# Patient Record
Sex: Female | Born: 1935 | Race: White | Hispanic: No | Smoking: Former smoker
Health system: Southern US, Community
[De-identification: ages and names within clinical notes are randomized; demographics above are authoritative.]

## PROBLEM LIST (undated history)

## (undated) DIAGNOSIS — E559 Vitamin D deficiency, unspecified: Secondary | ICD-10-CM

## (undated) DIAGNOSIS — D696 Thrombocytopenia, unspecified: Secondary | ICD-10-CM

## (undated) DIAGNOSIS — I517 Cardiomegaly: Secondary | ICD-10-CM

## (undated) DIAGNOSIS — J40 Bronchitis, not specified as acute or chronic: Secondary | ICD-10-CM

## (undated) DIAGNOSIS — R002 Palpitations: Secondary | ICD-10-CM

## (undated) DIAGNOSIS — B029 Zoster without complications: Secondary | ICD-10-CM

## (undated) DIAGNOSIS — I10 Essential (primary) hypertension: Secondary | ICD-10-CM

## (undated) DIAGNOSIS — R0989 Other specified symptoms and signs involving the circulatory and respiratory systems: Secondary | ICD-10-CM

## (undated) DIAGNOSIS — E785 Hyperlipidemia, unspecified: Secondary | ICD-10-CM

## (undated) DIAGNOSIS — D649 Anemia, unspecified: Secondary | ICD-10-CM

## (undated) HISTORY — PX: CHOLECYSTECTOMY: SHX55

## (undated) HISTORY — DX: Zoster without complications: B02.9

## (undated) HISTORY — PX: ABDOMINAL HYSTERECTOMY: SHX81

## (undated) HISTORY — PX: CATARACT EXTRACTION: SUR2

## (undated) HISTORY — PX: OTHER SURGICAL HISTORY: SHX169

## (undated) HISTORY — DX: Hyperlipidemia, unspecified: E78.5

## (undated) HISTORY — PX: DENTAL SURGERY: SHX609

## (undated) HISTORY — DX: Essential (primary) hypertension: I10

## (undated) HISTORY — PX: CERVICAL SPINE SURGERY: SHX589

## (undated) HISTORY — DX: Bronchitis, not specified as acute or chronic: J40

## (undated) HISTORY — PX: LUMBAR SPINE SURGERY: SHX701

## (undated) HISTORY — DX: Cardiomegaly: I51.7

## (undated) HISTORY — PX: SHOULDER SURGERY: SHX246

## (undated) HISTORY — DX: Palpitations: R00.2

## (undated) HISTORY — PX: EXTERNAL EAR SURGERY: SHX627

## (undated) HISTORY — PX: JOINT REPLACEMENT: SHX530

---

## 1998-01-21 ENCOUNTER — Ambulatory Visit (HOSPITAL_COMMUNITY): Admission: RE | Admit: 1998-01-21 | Discharge: 1998-01-21 | Payer: Self-pay | Admitting: Neurosurgery

## 1998-02-10 ENCOUNTER — Inpatient Hospital Stay (HOSPITAL_COMMUNITY): Admission: RE | Admit: 1998-02-10 | Discharge: 1998-02-14 | Payer: Self-pay | Admitting: Neurosurgery

## 1998-06-15 ENCOUNTER — Ambulatory Visit (HOSPITAL_COMMUNITY): Admission: RE | Admit: 1998-06-15 | Discharge: 1998-06-15 | Payer: Self-pay | Admitting: Neurosurgery

## 1998-06-24 ENCOUNTER — Encounter: Admission: RE | Admit: 1998-06-24 | Discharge: 1998-07-21 | Payer: Self-pay | Admitting: Neurosurgery

## 1998-12-30 ENCOUNTER — Inpatient Hospital Stay (HOSPITAL_COMMUNITY): Admission: EM | Admit: 1998-12-30 | Discharge: 1998-12-31 | Payer: Self-pay | Admitting: Emergency Medicine

## 1998-12-30 ENCOUNTER — Encounter: Payer: Self-pay | Admitting: Cardiology

## 1999-03-02 ENCOUNTER — Ambulatory Visit (HOSPITAL_COMMUNITY): Admission: RE | Admit: 1999-03-02 | Discharge: 1999-03-02 | Payer: Self-pay | Admitting: Gastroenterology

## 1999-06-14 ENCOUNTER — Ambulatory Visit (HOSPITAL_COMMUNITY): Admission: RE | Admit: 1999-06-14 | Discharge: 1999-06-14 | Payer: Self-pay | Admitting: Neurosurgery

## 1999-08-04 ENCOUNTER — Encounter: Admission: RE | Admit: 1999-08-04 | Discharge: 1999-08-04 | Payer: Self-pay | Admitting: Neurosurgery

## 1999-08-25 ENCOUNTER — Ambulatory Visit (HOSPITAL_COMMUNITY): Admission: RE | Admit: 1999-08-25 | Discharge: 1999-08-25 | Payer: Self-pay | Admitting: Orthopedic Surgery

## 1999-08-25 ENCOUNTER — Encounter: Payer: Self-pay | Admitting: Orthopedic Surgery

## 1999-09-26 ENCOUNTER — Encounter: Payer: Self-pay | Admitting: Orthopedic Surgery

## 1999-09-29 ENCOUNTER — Encounter: Payer: Self-pay | Admitting: Orthopedic Surgery

## 1999-09-29 ENCOUNTER — Inpatient Hospital Stay (HOSPITAL_COMMUNITY): Admission: RE | Admit: 1999-09-29 | Discharge: 1999-10-04 | Payer: Self-pay | Admitting: Orthopedic Surgery

## 1999-11-06 ENCOUNTER — Encounter: Admission: RE | Admit: 1999-11-06 | Discharge: 1999-12-07 | Payer: Self-pay | Admitting: Orthopedic Surgery

## 2001-03-30 ENCOUNTER — Ambulatory Visit (HOSPITAL_COMMUNITY): Admission: RE | Admit: 2001-03-30 | Discharge: 2001-03-30 | Payer: Self-pay | Admitting: Neurosurgery

## 2001-04-15 ENCOUNTER — Encounter: Admission: RE | Admit: 2001-04-15 | Discharge: 2001-04-15 | Payer: Self-pay | Admitting: Neurosurgery

## 2001-05-02 ENCOUNTER — Encounter: Admission: RE | Admit: 2001-05-02 | Discharge: 2001-05-02 | Payer: Self-pay | Admitting: Neurosurgery

## 2001-05-27 ENCOUNTER — Encounter: Payer: Self-pay | Admitting: Internal Medicine

## 2001-05-27 ENCOUNTER — Encounter: Admission: RE | Admit: 2001-05-27 | Discharge: 2001-05-27 | Payer: Self-pay | Admitting: Internal Medicine

## 2005-05-07 DIAGNOSIS — B029 Zoster without complications: Secondary | ICD-10-CM

## 2005-05-07 HISTORY — DX: Zoster without complications: B02.9

## 2009-05-30 ENCOUNTER — Encounter: Admission: RE | Admit: 2009-05-30 | Discharge: 2009-05-30 | Payer: Self-pay | Admitting: Family Medicine

## 2009-11-04 ENCOUNTER — Ambulatory Visit: Payer: Self-pay | Admitting: Internal Medicine

## 2009-11-04 DIAGNOSIS — J449 Chronic obstructive pulmonary disease, unspecified: Secondary | ICD-10-CM | POA: Insufficient documentation

## 2009-11-07 DIAGNOSIS — E785 Hyperlipidemia, unspecified: Secondary | ICD-10-CM | POA: Insufficient documentation

## 2009-11-07 DIAGNOSIS — Z87891 Personal history of nicotine dependence: Secondary | ICD-10-CM

## 2009-11-07 DIAGNOSIS — I10 Essential (primary) hypertension: Secondary | ICD-10-CM | POA: Insufficient documentation

## 2009-11-08 LAB — CONVERTED CEMR LAB
BUN: 14 mg/dL (ref 6–23)
Basophils Absolute: 0.1 10*3/uL (ref 0.0–0.1)
Basophils Relative: 0.7 % (ref 0.0–3.0)
Calcium: 9.6 mg/dL (ref 8.4–10.5)
Chloride: 105 meq/L (ref 96–112)
Creatinine, Ser: 0.9 mg/dL (ref 0.4–1.2)
Eosinophils Absolute: 0.1 10*3/uL (ref 0.0–0.7)
HCT: 33.9 % — ABNORMAL LOW (ref 36.0–46.0)
Hemoglobin: 11.9 g/dL — ABNORMAL LOW (ref 12.0–15.0)
Lymphs Abs: 2.1 10*3/uL (ref 0.7–4.0)
MCHC: 35.1 g/dL (ref 30.0–36.0)
MCV: 96.1 fL (ref 78.0–100.0)
Monocytes Absolute: 1.2 10*3/uL — ABNORMAL HIGH (ref 0.1–1.0)
Neutro Abs: 7.3 10*3/uL (ref 1.4–7.7)
RBC: 3.53 M/uL — ABNORMAL LOW (ref 3.87–5.11)
RDW: 12.9 % (ref 11.5–14.6)

## 2009-11-25 ENCOUNTER — Ambulatory Visit: Payer: Self-pay | Admitting: Internal Medicine

## 2010-01-24 ENCOUNTER — Ambulatory Visit: Payer: Self-pay | Admitting: Internal Medicine

## 2010-06-06 NOTE — Assessment & Plan Note (Signed)
Summary: f/u 3 wks///kp   Primary Provider/Referring Provider:  Windle Guard  CC:  3 week follow up visit-Chronic Bronchitis; SOB and cough-worse at night..  History of Present Illness: History of Present Illness: November 04, 2009 77 yoF , widow of a patient who died 13 years ago w/ COPD. Self referred now because of cough since November, 2010. She saw Dr Jeannetta Nap in [place] with fever and told CXR showed pneumonia, treated then w/ antibotic and another in January.. Occipital headache from cough. Yellow, nonbloody sputm, shortness of breath, 10 lb weight loss. Cough has gradualy improved.  Denies nasal congestion or sinus pain. Denies reflux. Wakes coughing from phlegm. She had smoked and dipped snuff a few years in the 1950's.  November 25, 2009- COPD, Chronic bronchitis.........Marland Kitchenhere w/ son Since here in early July, she  finds cough is worse lying down in evening. Took antibiotic and mucinex which did help some. Mucus is yellow. Has twinge pains substernal - I can't tell from her that it is radiating or specifically exertional. Discussed home humidifier. CBC- Slight variance in counts, but ok.  Chem- ok CXR- Will want repeat in 2 months, mild left base opacity and and maybe some fullness ad right hilum to be watched.   Preventive Screening-Counseling & Management  Alcohol-Tobacco     Smoking Status: quit     Year Quit: 1950's  Current Medications (verified): 1)  Aspirin 81 Mg Tbec (Aspirin) .... Take 1 By Mouth Once Daily 2)  Vitamin D (Ergocalciferol) 50000 Unit Caps (Ergocalciferol) .... Take 1 By Mouth Every 2 Weeks 3)  Verapamil Hcl Cr 180 Mg Cr-Tabs (Verapamil Hcl) .... Take 1 By Mouth Once Daily 4)  Atenolol 50 Mg Tabs (Atenolol) .... Take 2 By Mouth Once Daily 5)  Tylenol Arthritis Pain 650 Mg Cr-Tabs (Acetaminophen) .... Take 2 By Mouth Once Daily 6)  Fish Oil 1000 Mg Caps (Omega-3 Fatty Acids) .... Take 1 By Mouth Three Times A Day  Allergies (verified): 1)  ! Pcn  Past  History:  Past Medical History: Last updated: 16-Nov-2009 Hypertension Palpitation Hyperlipidemia Cardiomegaly Bronchitis Shingles 2007- lost weight then  Past Surgical History: Last updated: November 16, 2009 Cervical spine x 2 Lumbar spine x 2 Shoulder x 1 Left hip replacement  Family History: Last updated: Nov 16, 2009 Father- died heart Mother- died cancer Child died COPD (75 yo) Child  died ? cancer Child died MVA  Social History: Last updated: Nov 16, 2009 Widowed with children.....married x2 Older son lives with pt quit smoking 1950's  Risk Factors: Smoking Status: quit (11/25/2009)  Review of Systems      See HPI       The patient complains of shortness of breath with activity and productive cough.  The patient denies shortness of breath at rest, non-productive cough, coughing up blood, chest pain, irregular heartbeats, acid heartburn, indigestion, loss of appetite, weight change, abdominal pain, difficulty swallowing, sore throat, tooth/dental problems, headaches, nasal congestion/difficulty breathing through nose, and sneezing.    Vital Signs:  Patient profile:   77 year old female Weight:      129.25 pounds O2 Sat:      95 % on Room air Pulse rate:   93 / minute BP sitting:   140 / 100  (right arm) Cuff size:   regular  Vitals Entered By: Reynaldo Minium CMA (November 25, 2009 2:17 PM)  O2 Flow:  Room air CC: 3 week follow up visit-Chronic Bronchitis; SOB and cough-worse at night.   Physical Exam  Additional Exam:  General: A/Ox3; pleasant and cooperative, NAD, elderly SKIN: no rash, lesions NODES: no lymphadenopathy HEENT: /AT, EOM- WNL, Conjuctivae- clear, PERRLA, TM-WNL, Nose- clear, Throat- clear and wnl, not red, Mallampati  II, dentures NECK: Supple w/ fair ROM, JVD- none, normal carotid impulses w/o bruits Thyroid-  CHEST: crackles bilateral lung fields, no wheeze or rhonchi- dry raspy cough HEART: RRR, occasioal skipped beat, no m/g/r heard ABDOMEN:  Soft and nl;  ZOX:WRUE, nl pulses, no edema., cyanosis or clubbing NEURO: Grossly intact to observation      Impression & Recommendations:  Problem # 1:  BRONCHITIS, CHRONIC (ICD-491.9)  We will give neb and depo today, then try her with sample Symbicort.  We will want to f/u CXR in a couple of months.  Medications Added to Medication List This Visit: 1)  Symbicort 80-4.5 Mcg/act Aero (Budesonide-formoterol fumarate) .... 2 puffs and rinse mouth, twice daily  Other Orders: Est. Patient Level IV (45409) Admin of Therapeutic Inj  intramuscular or subcutaneous (81191) Depo- Medrol 80mg  (J1040) Nebulizer Tx (47829)  Patient Instructions: 1)  Please schedule a follow-up appointment in 2 months. 2)  Sample / script Symbicort 80/4.5,  3)  2 puffs and rinse mouth, twice daily 4)  neb xop 0.63 5)  depo 80 Prescriptions: SYMBICORT 80-4.5 MCG/ACT AERO (BUDESONIDE-FORMOTEROL FUMARATE) 2 puffs and rinse mouth, twice daily  #1 x prn   Entered and Authorized by:   Waymon Budge MD   Signed by:   Waymon Budge MD on 11/25/2009   Method used:   Print then Give to Patient   RxID:   [id number]       Medication Administration  Injection # 1:    Medication: Depo- Medrol 80mg     Diagnosis: BRONCHITIS, CHRONIC (ICD-491.9)    Route: SQ    Site: LUOQ gluteus    Exp Date: 08/2012    Lot #: [id number]    Mfr: Pharmacia    Patient tolerated injection without complications    Given by: Reynaldo Minium CMA (November 25, 2009 5:15 PM)  Medication # 1:    Medication: Xopenex 0.63mg     Diagnosis: BRONCHITIS, CHRONIC (ICD-491.9)    Dose: 1 vial    Route: inhaled    Exp Date: 12/2009    Lot #: [id number]    Mfr: Sepracor    Patient tolerated medication without complications    Given by: Reynaldo Minium CMA (November 25, 2009 5:16 PM)  Orders Added: 1)  Est. Patient Level IV [24401] 2)  Admin of Therapeutic Inj  intramuscular or subcutaneous [96372] 3)  Depo- Medrol 80mg  [J1040] 4)  Nebulizer  Tx [02725]

## 2010-06-06 NOTE — Assessment & Plan Note (Signed)
Summary: rov 2 months///kp   Primary Provider/Referring Provider:  Windle Guard  CC:  2 month follow up and states she is less sob.  History of Present Illness: History of Present Illness: November 04, 2009 83 yoF , widow of a patient who died 13 years ago w/ COPD. Self referred now because of cough since November, 2010. She saw Dr Jeannetta Nap in [place] with fever and told CXR showed pneumonia, treated then w/ antibotic and another in January.. Occipital headache from cough. Yellow, nonbloody sputm, shortness of breath, 10 lb weight loss. Cough has gradualy improved.  Denies nasal congestion or sinus pain. Denies reflux. Wakes coughing from phlegm. She had smoked and dipped snuff a few years in the 1950's.  November 25, 2009- COPD, Chronic bronchitis.........Marland Kitchenhere w/ son Since here in early July, she  finds cough is worse lying down in evening. Took antibiotic and mucinex which did help some. Mucus is yellow. Has twinge pains substernal - I can't tell from her that it is radiating or specifically exertional. Discussed home humidifier. CBC- Slight variance in counts, but ok.  Chem- ok CXR- Will want repeat in 2 months, mild left base opacity and and maybe some fullness at right hilum to be watched.  January 24, 2010- COPD, chronic bronchitis, abnormal CXR She says she has slowly improved since here in July. Mostly she is stronger. Occasionally if upset gets substernal pain- self limited and rare. Feels less short of breath, with little to no cough or wheeze. Works at coughing each day to clear out some yellow about once daily with no blood. Denies night sweat or fever. Perevious CXr was indistinct at right heart border. We reviewed this and will repeat to exclude an active process.   Preventive Screening-Counseling & Management  Alcohol-Tobacco     Smoking Status: quit > 6 months     Year Quit: 1950  Current Medications (verified): 1)  Aspirin 81 Mg Tbec (Aspirin) .... Take 1 By Mouth Once  Daily 2)  Vitamin D (Ergocalciferol) 50000 Unit Caps (Ergocalciferol) .... Take 1 By Mouth Every 2 Weeks 3)  Verapamil Hcl Cr 180 Mg Cr-Tabs (Verapamil Hcl) .... Take 1 By Mouth Once Daily 4)  Atenolol 50 Mg Tabs (Atenolol) .... Take 2 By Mouth Once Daily 5)  Tylenol Arthritis Pain 650 Mg Cr-Tabs (Acetaminophen) .... Take 2 By Mouth Once Daily 6)  Fish Oil 1000 Mg Caps (Omega-3 Fatty Acids) .... Take 1 By Mouth Three Times A Day 7)  Symbicort 80-4.5 Mcg/act Aero (Budesonide-Formoterol Fumarate) .... 2 Puffs and Rinse Mouth, Twice Daily  Allergies (verified): 1)  ! Pcn  Past History:  Past Medical History: Last updated: 12-01-09 Hypertension Palpitation Hyperlipidemia Cardiomegaly Bronchitis Shingles 2007- lost weight then  Past Surgical History: Last updated: 12-01-2009 Cervical spine x 2 Lumbar spine x 2 Shoulder x 1 Left hip replacement  Family History: Last updated: December 01, 2009 Father- died heart Mother- died cancer Child died COPD (63 yo) Child  died ? cancer Child died MVA  Social History: Last updated: 12-01-09 Widowed with children.....married x2 Older son lives with pt quit smoking 1950's  Risk Factors: Smoking Status: quit > 6 months (01/24/2010)  Social History: Smoking Status:  quit > 6 months  Review of Systems      See HPI       The patient complains of shortness of breath with activity, non-productive cough, and chest pain.  The patient denies shortness of breath at rest, productive cough, coughing up blood, irregular heartbeats, acid heartburn,  indigestion, loss of appetite, weight change, abdominal pain, difficulty swallowing, sore throat, tooth/dental problems, headaches, nasal congestion/difficulty breathing through nose, and sneezing.    Vital Signs:  Patient profile:   77 year old female Height:      63 inches Weight:      124.8 pounds BMI:     22.19 O2 Sat:      100 % on Room air Pulse rate:   79 / minute BP sitting:   154 / 88   (left arm)  Vitals Entered By: Renold Genta RCP, LPN (January 24, 2010 2:15 PM)  O2 Flow:  Room air CC: 2 month follow up, states she is less sob Comments Medications reviewed with patient Renold Genta RCP, LPN  January 24, 2010 2:19 PM    Physical Exam  Additional Exam:  General: A/Ox3; pleasant and cooperative, NAD, elderly, talkative SKIN: no rash, lesions NODES: no lymphadenopathy HEENT: [place]/AT, EOM- WNL, Conjuctivae- clear, PERRLA, TM-WNL, Nose- clear, Throat- clear and wnl, not red, Mallampati  II, dentures NECK: Supple w/ fair ROM, JVD- none, normal carotid impulses w/o bruits Thyroid-  CHEST: crackles bilateral lung fields, no wheeze or rhonchi- dry raspy cough HEART: RRR, occasioal skipped beat, no m/g/r heard ABDOMEN: Soft and nl;  ZHY:QMVH, nl pulses, no edema., cyanosis or clubbing NEURO: Grossly intact to observation      Impression & Recommendations:  Problem # 1:  BRONCHITIS, CHRONIC (ICD-491.9)  She has to work to clear any mucus. Dyspnea doesn't limit her acitivity as much as back pain. She is active at home, not able to sit still for very long, which probably has been a good trait. Abnmormal CXR last time- we will update today.  Flu vax  Orders: Est. Patient Level IV (99214) T-2 View CXR (71020TC)  Problem # 2:  TOBACCO USE, QUIT (ICD-V15.82) She had stopped smoking a long time ago, with intermittent passive exposure since. We discussed the chronic effects of remote smoking .  Patient Instructions: 1)  Please schedule a follow-up appointment in 6 months. 2)  Flu vax 3)  A chest x-ray has been recommended.  Your imaging study may require preauthorization.

## 2010-06-06 NOTE — Assessment & Plan Note (Signed)
Summary: SICK/COUGH/FOR  5 MONTHS/CB   Primary Provider/Referring Provider:  Windle Guard  CC:  Pulmonary new pt-cough and sick since 03-2009.Marland Kitchen  History of Present Illness: November 04, 2009 77 yoF , widow of a patient who died 13 years ago w/ COPD. Self referred now because of cough since November, 2010. She saw Dr Jeannetta Nap in [place] with fever and told CXR showed pneumonia, treated then w/ antibotic and another in January.. Occipital headache from cough. Yellow, nonbloody sputm, shortness of breath, 10 lb weight loss. Cough has gradualy improved.  Denies nasal congestion or sinus pain. Denies reflux. Wakes coughing from phlegm. She had smoked and dipped snuff a few years in the 1950's.  Preventive Screening-Counseling & Management  Alcohol-Tobacco     Smoking Status: quit     Year Quit: 1950's  Comments: Smoked for 3 years, dipped snuff. Husband and one child died of COPD, implying hx of passive smoking.  Current Medications (verified): 1)  Aspirin 81 Mg Tbec (Aspirin) .... Take 1 By Mouth Once Daily 2)  Vitamin D (Ergocalciferol) 50000 Unit Caps (Ergocalciferol) .... Take 1 By Mouth Every 2 Weeks 3)  Verapamil Hcl Cr 180 Mg Cr-Tabs (Verapamil Hcl) .... Take 1 By Mouth Once Daily 4)  Atenolol 50 Mg Tabs (Atenolol) .... Take 2 By Mouth Once Daily 5)  Tylenol Arthritis Pain 650 Mg Cr-Tabs (Acetaminophen) .... Take 2 By Mouth Once Daily 6)  Fish Oil 1000 Mg Caps (Omega-3 Fatty Acids) .... Take 1 By Mouth Three Times A Day  Allergies (verified): 1)  ! Pcn  Past History:  Past Medical History: Hypertension Palpitation Hyperlipidemia Cardiomegaly Bronchitis Shingles 2007- lost weight then  Past Surgical History: Cervical spine x 2 Lumbar spine x 2 Shoulder x 1 Left hip replacement  Family History: Father- died heart Mother- died cancer Child died COPD (7 yo) Child  died ? cancer Child died MVA  Social History: Widowed with children.....married x2 Older son lives with  pt quit smoking 1950'sSmoking Status:  quit  Review of Systems      See HPI       The patient complains of shortness of breath with activity, productive cough, chest pain, irregular heartbeats, loss of appetite, weight change, tooth/dental problems, headaches, sneezing, anxiety, depression, and joint stiffness or pain.  The patient denies shortness of breath at rest, non-productive cough, coughing up blood, indigestion, abdominal pain, difficulty swallowing, sore throat, nasal congestion/difficulty breathing through nose, itching, ear ache, hand/feet swelling, rash, change in color of mucus, and fever.    Vital Signs:  Patient profile:   77 year old female Weight:      128 pounds O2 Sat:      97 % on Room air Pulse rate:   82 / minute BP sitting:   120 / 80  (left arm) Cuff size:   regular  Vitals Entered By: Reynaldo Minium CMA (November 04, 2009 2:39 PM)  O2 Flow:  Room air CC: Pulmonary new pt-cough and sick since 03-2009.   Physical Exam  Additional Exam:  General: A/Ox3; pleasant and cooperative, NAD, elderly SKIN: no rash, lesions NODES: no lymphadenopathy HEENT: [place]/AT, EOM- WNL, Conjuctivae- clear, PERRLA, TM-WNL, Nose- clear, Throat- clear and wnl, not red, Mallampati  II, dentures NECK: Supple w/ fair ROM, JVD- none, normal carotid impulses w/o bruits Thyroid- normal to palpation CHEST: crackles bilateral lung fields, no wheeze or rhonchi HEART: RRR, occasioal skipped beat, no m/g/r heard ABDOMEN: Soft and nl; nml bowel sounds; no organomegaly or masses  noted ZOX:WRUE, nl pulses, no edema., cyanosis or clubbing NEURO: Grossly intact to observation      Impression & Recommendations:  Problem # 1:  BRONCHITIS, CHRONIC (ICD-491.9)  Pending CXR, I am concerned about bilateral crackles. Saturation is good. We will check CBC, BMP and until results available, we will give trial of biaxin for what she thinks is residual of bronchopneumonia she had last winter.  Problem # 2:   TOBACCO USE, QUIT (ICD-V15.82) She had sustained tobacco exposure years ago, mostly passive exposure to family members, but enough to put her at risk for tobacco induced diseases.  Medications Added to Medication List This Visit: 1)  Aspirin 81 Mg Tbec (Aspirin) .... Take 1 by mouth once daily 2)  Vitamin D (ergocalciferol) 50000 Unit Caps (Ergocalciferol) .... Take 1 by mouth every 2 weeks 3)  Verapamil Hcl Cr 180 Mg Cr-tabs (Verapamil hcl) .... Take 1 by mouth once daily 4)  Atenolol 50 Mg Tabs (Atenolol) .... Take 2 by mouth once daily 5)  Tylenol Arthritis Pain 650 Mg Cr-tabs (Acetaminophen) .... Take 2 by mouth once daily 6)  Fish Oil 1000 Mg Caps (Omega-3 fatty acids) .... Take 1 by mouth three times a day 7)  Biaxin 500 Mg Tabs (Clarithromycin) .Marland Kitchen.. 1 twice daily, after meals  Other Orders: New Patient Level IV (45409) T-2 View CXR (71020TC) TLB-CBC Platelet - w/Differential (85025-CBCD) TLB-BMP (Basic Metabolic Panel-BMET) (80048-METABOL)  Patient Instructions: 1)  Please schedule a follow-up appointment in 3 weeks. 2)  A chest x-ray has been recommended.  Your imaging study may require preauthorization.  3)  Lab 4)  Script for antibiotic 5)  Mucinex otc can help thin mucus so you can cough it out easier  Prescriptions: BIAXIN 500 MG TABS (CLARITHROMYCIN) 1 twice daily, after meals  #14 x 0   Entered and Authorized by:   Waymon Budge MD   Signed by:   Waymon Budge MD on 11/04/2009   Method used:   Print then Give to Patient   RxID:   [id number]

## 2010-07-24 ENCOUNTER — Other Ambulatory Visit: Payer: Self-pay | Admitting: Internal Medicine

## 2010-07-24 ENCOUNTER — Encounter: Payer: Self-pay | Admitting: Internal Medicine

## 2010-07-24 ENCOUNTER — Ambulatory Visit (INDEPENDENT_AMBULATORY_CARE_PROVIDER_SITE_OTHER): Payer: Medicare Other | Admitting: Internal Medicine

## 2010-07-24 ENCOUNTER — Ambulatory Visit (INDEPENDENT_AMBULATORY_CARE_PROVIDER_SITE_OTHER)
Admission: RE | Admit: 2010-07-24 | Discharge: 2010-07-24 | Disposition: A | Discharge status: Home / Self Care | Payer: Medicare Other | Source: Ambulatory Visit | Attending: Internal Medicine | Admitting: Internal Medicine

## 2010-07-24 ENCOUNTER — Encounter (INDEPENDENT_AMBULATORY_CARE_PROVIDER_SITE_OTHER): Payer: Self-pay | Admitting: *Deleted

## 2010-07-24 ENCOUNTER — Other Ambulatory Visit: Payer: Medicare Other

## 2010-07-24 DIAGNOSIS — J42 Unspecified chronic bronchitis: Secondary | ICD-10-CM

## 2010-07-24 LAB — CBC WITH DIFFERENTIAL/PLATELET
Basophils Relative: 0.3 % (ref 0.0–3.0)
Eosinophils Absolute: 0.1 10*3/uL (ref 0.0–0.7)
Eosinophils Relative: 1.6 % (ref 0.0–5.0)
Lymphocytes Relative: 21.2 % (ref 12.0–46.0)
MCV: 96.8 fl (ref 78.0–100.0)
Monocytes Absolute: 1.1 10*3/uL — ABNORMAL HIGH (ref 0.1–1.0)
Neutrophils Relative %: 64 % (ref 43.0–77.0)
Platelets: 187 10*3/uL (ref 150.0–400.0)
RBC: 3.49 Mil/uL — ABNORMAL LOW (ref 3.87–5.11)
WBC: 8.4 10*3/uL (ref 4.5–10.5)

## 2010-08-03 NOTE — Assessment & Plan Note (Signed)
Summary: rov 6 months/kp   Primary Provider/Referring Provider:  Windle Guard  CC:  6 month follow up visit-chronic bronchitis; still coughing up some phlegm with yellow in color. and CHF Management.  History of Present Illness: November 25, 2009- COPD, Chronic bronchitis.........Marland Kitchenhere w/ son Since here in early July, she  finds cough is worse lying down in evening. Took antibiotic and mucinex which did help some. Mucus is yellow. Has twinge pains substernal - I can't tell from her that it is radiating or specifically exertional. Discussed home humidifier. CBC- Slight variance in counts, but ok.  Chem- ok CXR- Will want repeat in 2 months, mild left base opacity and and maybe some fullness at right hilum to be watched.  January 24, 2010- COPD, chronic bronchitis, abnormal CXR She says she has slowly improved since here in July. Mostly she is stronger. Occasionally if upset gets substernal pain- self limited and rare. Feels less short of breath, with little to no cough or wheeze. Works at coughing each day to clear out some yellow about once daily with no blood. Denies night sweat or fever. Perevious CXr was indistinct at right heart border. We reviewed this and will repeat to exclude an active process.  July 24, 2010-  COPD, chronic bronchitis, abnormal CXR Nurse-CC: 6 month follow up visit-chronic bronchitis; still coughing up some phlegm with yellow in color. Stays inside a lot- not aware of pollen changes and feels well.  CXR 01/24/10 reviewed w/ her- stable since previous July w/ prominent markings. Cough is much better, but still present. white to yellow. No blood. does note night sweats- she thinks it was from Celexa and stress of supporting son out of work. Slow weight loss over several years. Never TB exposure. Denies breast nodule. Compared to her status over the winter,  she says she is no longer short of breath or pounding heart.        Preventive Screening-Counseling &  Management  Alcohol-Tobacco     Smoking Status: quit > 6 months     Year Quit: 1950  Current Medications (verified): 1)  Aspirin 81 Mg Tbec (Aspirin) .... Take 1 By Mouth Once Daily 2)  Vitamin D (Ergocalciferol) 50000 Unit Caps (Ergocalciferol) .... Take 1 By Mouth Every 2 Weeks 3)  Verapamil Hcl Cr 180 Mg Cr-Tabs (Verapamil Hcl) .... Take 1 By Mouth Once Daily 4)  Atenolol 50 Mg Tabs (Atenolol) .... Take 2 By Mouth Once Daily 5)  Tylenol Arthritis Pain 650 Mg Cr-Tabs (Acetaminophen) .... Take 2 By Mouth Once Daily 6)  Symbicort 80-4.5 Mcg/act Aero (Budesonide-Formoterol Fumarate) .... 2 Puffs and Rinse Mouth, Twice Daily 7)  Citalopram Hydrobromide 40 Mg Tabs (Citalopram Hydrobromide) .... Take 1 By Mouth Once Daily  Allergies (verified): 1)  ! Pcn  Past History:  Past Medical History: Last updated: 12-04-09 Hypertension Palpitation Hyperlipidemia Cardiomegaly Bronchitis Shingles 2007- lost weight then  Past Surgical History: Last updated: 12/04/2009 Cervical spine x 2 Lumbar spine x 2 Shoulder x 1 Left hip replacement  Family History: Last updated: 12/04/2009 Father- died heart Mother- died cancer Child died COPD (33 yo) Child  died ? cancer Child died MVA  Social History: Last updated: 2009-12-04 Widowed with children.....married x2 Older son lives with pt quit smoking 1950's  Risk Factors: Smoking Status: quit > 6 months (07/24/2010)  Review of Systems      See HPI       The patient complains of productive cough.  The patient denies shortness of breath with activity,  shortness of breath at rest, coughing up blood, chest pain, irregular heartbeats, acid heartburn, indigestion, loss of appetite, weight change, abdominal pain, difficulty swallowing, sore throat, tooth/dental problems, headaches, nasal congestion/difficulty breathing through nose, and sneezing.    Vital Signs:  Patient profile:   77 year old female Height:      63 inches Weight:       122 pounds BMI:     21.69 O2 Sat:      93 % on Room air Pulse rate:   75 / minute BP sitting:   118 / 66  (left arm) Cuff size:   regular  Vitals Entered By: Reynaldo Minium CMA (July 24, 2010 2:52 PM)  O2 Flow:  Room air CC: 6 month follow up visit-chronic bronchitis; still coughing up some phlegm with yellow in color., CHF Management   Physical Exam  Additional Exam:  General: A/Ox3; pleasant and cooperative, NAD, elderly, talkative SKIN: no rash, lesions NODES                             : I feel soft mobile nodes in L>R axillae.  HEENT: [place]/AT, EOM- WNL, Conjuctivae- clear, PERRLA, TM-WNL, Nose- clear, Throat- clear and wnl, not red, Mallampati  II, dentures NECK: Supple w/ fair ROM, JVD- none, normal carotid impulses w/o bruits Thyroid-  CHEST: faint crackles bilateral lung fields, no wheeze or rhonchi- HEART: RRR, occasional skipped beat, no m/g/r heard ABDOMEN: Soft and nl;  EAV:WUJW, nl pulses, no edema., cyanosis or clubbing NEURO: Grossly intact to observation      Impression & Recommendations:  Problem # 1:  BRONCHITIS, CHRONIC (ICD-491.9)  Bronchitis with fibrotic scarring, subjectively improved. I will update CXR. Note report of some night sweats. I am not sure if I am feeling axillary nodes( left > right) vs rolling muscle since she's thin. At age 42, she indicates leaning toward conservative management.  Medications Added to Medication List This Visit: 1)  Citalopram Hydrobromide 40 Mg Tabs (Citalopram hydrobromide) .... Take 1 by mouth once daily  Other Orders: Est. Patient Level III (99213) T-2 View CXR (71020TC) TLB-CBC Platelet - w/Differential (85025-CBCD)  CHF Assessment/Plan:      The patient's current weight is 122 pounds.  Her previous weight was 124.8 pounds.    Patient Instructions: 1)  Please schedule a follow-up appointment in 3 months. 2)  A chest x-ray has been recommended.  Your imaging study may require preauthorization.  3)   lab   Immunization History:  Influenza Immunization History:    Influenza:  historical (01/24/2010)

## 2010-09-11 ENCOUNTER — Encounter: Payer: Self-pay | Admitting: Internal Medicine

## 2010-09-22 NOTE — Discharge Summary (Signed)
[place]  Patient:    Rhonda Moore, Rhonda Moore                         MRN: [id number] Adm. Date:  [id number] Disc. Date: [id number] Attending:  Twana First Dictator:   Kirstin Adelberger, P.A. CC:         Elana Alm. Thurston Hole, M.D.                           Discharge Summary  HISTORY OF PRESENT ILLNESS:  The patient is a 77 year old white female with a history of left hip pain for the past one to two years.  She has had increasing difficulty with ambulation and her activities of daily living despite conservative treatment including an articular cortisone injection and oral antiinflammatories.  She understands the risks, benefits and possible complications of the left total hip replacement and is without question.  ALLERGIES:  PENICILLIN.  MEDICATIONS: 1. Prinivil 25 mg one p.o. q.a.m. 2. Atenolol 50 mg one p.o. q.a.m. 3. Elavil 25 mg one p.o. q.h.s. 4. Premarin 0.625 mg one p.o. q.h.s.  PAST MEDICAL HISTORY: 1. Hypertension. 2. Gout. 3. Syncopal episodes.  PAST SURGICAL HISTORY: 1. Back surgery in 1998 and 1999. 2. Cholecystectomy. 3. Cervical fusion x 2. 4. Hand surgery. 5. Left shoulder surgery. 6. Bilateral vein stripping.  HOSPITAL COURSE:  On Sep 29, 1999, the patient underwent left total hip replacement.  She tolerated the procedure well.  On postop day #1, we kept the pain under control.  Hemoglobin was 8.4.  Left hip dressing was intact. Abduction pillow was in place.  Her neurovascular exam is intact.  THe patient was transfused with two units of packed rbcs and tolerated the transfusion well.  On postop day #2, the patient continued to improve.  Hemoglobin was up to 11.1 after two units.  The patient was alert and oriented.  On postop day #3, the patient was able to void and was tolerating her diet well.  Hemoglobin was down to 10.3.  INR is at 1.9.  On postop day #4, the patient continues to do well.  INR was at 2.3.  Surgical  wound is well-approximated.  The patient is ambulating 240 feet.  On postop day #5, the patient is having some hip pain but overall doing well.  INR was 2.8.  The surgical wound was well-approximated.  CONDITION ON DISCHARGE:  She was discharged to home in stable condition.  SPECIAL INSTRUCTIONS:  She will have home health PT and R.N. for PT draws.  FOLLOWUP:  We will see her back in the office on October 12, 1999. DD:  10/16/99 TD:  10/18/99 Job: [id number]

## 2010-09-22 NOTE — Op Note (Signed)
[place]  Patient:    Rhonda Moore, Rhonda Moore                         MRN: [id number] Proc. Date: 09/29/99 Adm. Date:  [id number] Attending:  Twana First                           Operative Report  PREOPERATIVE DIAGNOSIS:  Left hip degenerative joint disease.  POSTOPERATIVE DIAGNOSIS:  Left hip degenerative joint disease.  OPERATION: 1. Left total hip replacement using Osteonic total hip system with acetabulum,    #56 PSL Press-Fit acetabulum with two locking screws and 10 degree    polyethylene liner. 2. A #7 eon cemented femoral component with -3 x 28 mm femoral head with    11 mm centralizer and #3 cement plug.  SURGEON:  Elana Alm. Thurston Hole, M.D.  ASSISTANT:  Kirstin Adelberger, P.A.  ANESTHESIA:  General.  OPERATIVE TIME:  One hour 20 minutes.  ESTIMATED BLOOD LOSS:  250 cc.  COMPLICATIONS:  None.  DESCRIPTION OF PROCEDURE:  Ms. Joa was brought to the operating room on Sep 29, 1999, and placed on the operating table in the supine position.  After an adequate level of general anesthesia was obtained, her left hip was examined under anesthesia.  She had forward flexion at 95, extension to 0.  Internal and external rotation of 20 degrees.  She had approximately 1.5 cm of shortening of the left leg compared to the right.  Neurovascular status was normal.  She had a Foley catheter placed under sterile conditions and she received Ancef 1 g IV preoperatively for prophylaxis.  She was then turned in the left lateral decubitus position and secured on the bed with a Mark frame. Her left hip and leg was then prepped using sterile Betadine and draped using sterile technique.  Originally through a 20 cm posterior lateral greater trochanteric incision initial exposure was made.  The underlying subcutaneous tissues were incised in line with a skin incision.  The iliotibial band and gluteus maximus fascia was incised longitudinally revealing the  underlying short external rotators of the hip.  The sciatic nerve was carefully protected.  Short external rotators and hip capsule were released off the femoral neck, insertions intact.  The hip was then posteriorly dislocated, found to have significant grade IV changes both on the femoral head and the acetabulum.  A femoral neck cut was then made on 1.5 to 2 cm above the lesser trochanter and the appropriate amount of anteversion and inclination.  The acetabulum was then exposed.  Degenerative labrum was removed from around the acetabulum.  Sequential acetabular reamers were used up to a #56 size with the appropriate amount of abduction and anteversion.  This 56 was felt to be the appropriate size and then a 56 trial cup was placed and found to be an excellent fit.  This was then removed and then the actual 56 PSL acetabular component was hammered into position and the appropriate about of abduction and anteversion.  It was further secured with two locking screws, one in the 12 oclock and one in the 10 oclock position, a 16 and 20 mm screw being placed.  This gave an excellent further secure fit.  A 10 degree polyethylene liner was then placed in the posterolateral position and hammered into position locking it into the acetabular shell.  The proximal femur was then exposed.  Sequential axial reamers reamed with the proximal femur up to a #7 size followed by broaching to a #7 size.  With the #7 trial in place, first a +0 x 28 mm femoral neck and head trial was placed but this was moderately tight and then a -3 x 28 mm femoral neck and head was placed with the hip reduced to give excellent range of motion and restore leg lengths to equal, give excellent stability up to 50-60% degrees of internal rotation in both neutral and 30 degrees of adduction and also being stable in abduction and external rotation.  The femoral component trial was then removed.  The cement plug measured.  A #3 was  the appropriate size and this was placed down the femoral canal.  At this point the femoral canal was gently lavage irrigated with 3 L of saline solution.  Cement was then placed down the femoral canal and then the axial prosthesis #7 eon 127 degree neck angle was placed, held in position as the cement hardened and in excellent position on the femoral neck calcar.  After the cement hardened, the -3 x 28 mm femoral head was hammered onto the femoral neck with an excellent Morse taper fit.  The hip was then reduced, taken through a range of motion again, found to be excellent stability.  Leg lengths equal.  At this point, it was felt that all components were of excellent size, fit and stability.  The wound was further irrigated. The hip capsule and short external rotators were then reattached to the femoral neck through two drill holes in the greater trochanter.  The iliotibial band and gluteus maximus fascia was closed with #2 panacryl suture. The subcutaneous tissue was closed with 0 and 2-0 Vicryl.  The skin closed with skin staples.  Sterile dressings were applied.  A hip abduction pillow placed.  The patient then turned supine, extubated and taken to the recovery room in stable condition.  Needle and sponge counts correct x 2 at the end of the case. DD:  09/29/99 TD:  10/03/99 Job: [id number]

## 2010-10-19 ENCOUNTER — Encounter: Payer: Self-pay | Admitting: Internal Medicine

## 2010-10-24 ENCOUNTER — Ambulatory Visit (INDEPENDENT_AMBULATORY_CARE_PROVIDER_SITE_OTHER): Payer: Medicare Other | Admitting: Internal Medicine

## 2010-10-24 ENCOUNTER — Encounter: Payer: Self-pay | Admitting: Internal Medicine

## 2010-10-24 VITALS — BP 126/80 | HR 75 | Ht 63.0 in | Wt 125.0 lb

## 2010-10-24 DIAGNOSIS — J42 Unspecified chronic bronchitis: Secondary | ICD-10-CM

## 2010-10-24 NOTE — Patient Instructions (Signed)
Please let me know if you need me.

## 2010-10-24 NOTE — Assessment & Plan Note (Signed)
Chronic bronchitis. There is some residual scarring on CXR. We will check as needed. I don't think I can make her feel better with more meds now.

## 2010-10-24 NOTE — Progress Notes (Signed)
  Subjective:    Patient ID: Rhonda Moore, female    DOB: January 08, 1926, 77 y.o.   MRN: [id number]  HPI 10/24/10 - 77 yoF former smoker followed for COPD Last here July 24, 2010- note reviewed CXR 07/24/10- compared back to July, 2011 shows persistent density c/w scar left infrahilum, improved. I discussed this with her.  Feels more energy. Thinks she may be a little more short of breath in last few weeks with cough productive white/ yellow to clear. It is looser now.  Review of Systems Constitutional:   No weight loss, night sweats,  Fevers, chills, fatigue, lassitude. HEENT:   No headaches,  Difficulty swallowing,  Tooth/dental problems,  Sore throat,                No sneezing, itching, ear ache, nasal congestion, post nasal drip,   CV:  No chest pain,  Orthopnea, PND, swelling in lower extremities, anasarca, dizziness, palpitations  GI  No heartburn, indigestion, abdominal pain, nausea, vomiting, diarrhea, change in bowel habits, loss of appetite  Resp:.  No excess mucus,   No coughing up of blood.  No change in color of mucus.  No wheezing.  Skin: no rash or lesions.  GU: no dysuria, change in color of urine, no urgency or frequency.  No flank pain.  MS:  No joint pain or swelling.  No decreased range of motion.  No back pain.  Psych:  No change in mood or affect. No depression or anxiety.  No memory loss.      Objective:   Physical Exam General- Alert, Oriented, Affect-appropriate, Distress- none acute  Skin- rash-none, lesions- none, excoriation- none  Lymphadenopathy- none  Head- atraumatic  Eyes- Gross vision intact, PERRLA, conjunctivae clear, secretions  Ears- Hearing, canals, Tm- normal  Nose- Clear, No-Septal dev, mucus, polyps, erosion, perforation   Throat- Mallampati II , mucosa clear , drainage- none, tonsils- atrophic  Neck- flexible , trachea midline, no stridor , thyroid nl, carotid no bruit  Chest - symmetrical excursion , unlabored     Heart/CV- RRR ,  no murmur , no gallop  , no rub, nl s1 s2                     - JVD- none , edema- none, stasis changes- none, varices- none     Lung- clear to P&A, wheeze- none, cough- none , dullness-none, rub- none     Chest wall- Abd- tender-no, distended-no, bowel sounds-present, HSM- no  Br/ Gen/ Rectal- Not done, not indicated  Extrem- cyanosis- none, clubbing, none, atrophy- none, strength- nl  Neuro- grossly intact to observation         Assessment & Plan:

## 2010-12-22 ENCOUNTER — Other Ambulatory Visit: Payer: Self-pay | Admitting: *Deleted

## 2010-12-22 MED ORDER — BUDESONIDE-FORMOTEROL FUMARATE 80-4.5 MCG/ACT IN AERO: 2.0000 | INHALATION_SPRAY | Freq: Two times a day (BID) | RESPIRATORY_TRACT | Status: DC

## 2011-04-19 ENCOUNTER — Other Ambulatory Visit: Payer: Self-pay | Admitting: Internal Medicine

## 2011-04-26 ENCOUNTER — Ambulatory Visit (INDEPENDENT_AMBULATORY_CARE_PROVIDER_SITE_OTHER): Payer: Medicare Other | Admitting: Internal Medicine

## 2011-04-26 ENCOUNTER — Encounter: Payer: Self-pay | Admitting: Internal Medicine

## 2011-04-26 VITALS — BP 126/78 | HR 72 | Ht 63.0 in | Wt 125.0 lb

## 2011-04-26 DIAGNOSIS — J42 Unspecified chronic bronchitis: Secondary | ICD-10-CM

## 2011-04-26 MED ORDER — AZITHROMYCIN 250 MG PO TABS: ORAL_TABLET | ORAL | Status: AC

## 2011-04-26 MED ORDER — ALBUTEROL SULFATE HFA 108 (90 BASE) MCG/ACT IN AERS: 2.0000 | INHALATION_SPRAY | RESPIRATORY_TRACT | Status: DC | PRN

## 2011-04-26 NOTE — Progress Notes (Signed)
    Patient ID: Rhonda Moore, female    DOB: 20-Jul-1925, 77 y.o.   MRN: [id number]  HPI 10/24/10 - 79 yoF former smoker followed for COPD Last here July 24, 2010- note reviewed CXR 07/24/10- compared back to July, 2011 shows persistent density c/w scar left infrahilum, improved. I discussed this with her.  Feels more energy. Thinks she may be a little more short of breath in last few weeks with cough productive white/ yellow to clear. It is looser now.   04/26/11- 77 yoF former smoker followed for COPD Breathing varies, but ok. Had flu shot. Chokes in evening, before supper. Symbicort increased to 160- now sufficient. Still coughing yellow.   Review of Systems Constitutional:   No weight loss, night sweats,  Fevers, chills, fatigue, lassitude. Constitutional:   No-   weight loss, night sweats, fevers, chills, fatigue, lassitude. HEENT:   No-  headaches, difficulty swallowing, tooth/dental problems, sore throat,       No-  sneezing, itching, ear ache, nasal congestion, post nasal drip,  CV:  No-   chest pain, orthopnea, PND, swelling in lower extremities, anasarca, dizziness, palpitations Resp: No-   shortness of breath with exertion or at rest.               productive cough,  No non-productive cough,  No- coughing up of blood.              No-   change in color of mucus.  No- wheezing.   Skin: No-   rash or lesions. GI:  No-   heartburn, indigestion, abdominal pain, nausea, vomiting, diarrhea,                 change in bowel habits, loss of appetite GU MS:  No-   joint pain or swelling.  No- decreased range of motion.  No- back pain. Neuro-     nothing unusual Psych:  No- change in mood or affect. No depression or anxiety.  No memory loss.      Objective:   Physical Exam General- Alert, Oriented, Affect-appropriate, Distress- none acute Skin- rash-none, lesions- none, excoriation- none Lymphadenopathy- none Head- atraumatic            Eyes- Gross vision intact, PERRLA, conjunctivae  clear secretions            Ears- Hearing, canals-normal            Nose- Clear, no-Septal dev, mucus, polyps, erosion, perforation             Throat- Mallampati II , mucosa clear , drainage- none, tonsils- atrophic Neck- flexible , trachea midline, no stridor , thyroid nl, carotid no bruit Chest - symmetrical excursion , unlabored           Heart/CV- RRR , no murmur , no gallop  , no rub, nl s1 s2                           - JVD- none , edema- none, stasis changes- none, varices- none           Lung- mild rhonchi, wheeze- none, cough- none , dullness-none, rub- none           Chest wall-  Abd- tender-no, distended-no, bowel sounds-present, HSM- no Br/ Gen/ Rectal- Not done, not indicated Extrem- cyanosis- none, clubbing, none, atrophy- none, strength- nl Neuro- grossly intact to observation

## 2011-04-26 NOTE — Patient Instructions (Addendum)
    Script for Z pak sent - antibiotic  Script for albuterol rescue inhaler sent  - for use in between Symbicort doses if needed       Please call as needed.

## 2011-04-30 MED ORDER — BUDESONIDE-FORMOTEROL FUMARATE 160-4.5 MCG/ACT IN AERO: 2.0000 | INHALATION_SPRAY | Freq: Two times a day (BID) | RESPIRATORY_TRACT | Status: DC

## 2011-04-30 NOTE — Assessment & Plan Note (Signed)
Controlled, with room to improve if we can find a better strategy. Plan- Zpak, refill rescue inhaler with education.

## 2011-05-25 ENCOUNTER — Other Ambulatory Visit: Payer: Self-pay | Admitting: Allergy

## 2011-05-25 DIAGNOSIS — J42 Unspecified chronic bronchitis: Secondary | ICD-10-CM

## 2011-05-25 MED ORDER — BUDESONIDE-FORMOTEROL FUMARATE 160-4.5 MCG/ACT IN AERO: 2.0000 | INHALATION_SPRAY | Freq: Two times a day (BID) | RESPIRATORY_TRACT | Status: DC

## 2011-06-18 ENCOUNTER — Other Ambulatory Visit: Payer: Self-pay | Admitting: *Deleted

## 2011-06-18 DIAGNOSIS — J42 Unspecified chronic bronchitis: Secondary | ICD-10-CM

## 2011-06-18 MED ORDER — BUDESONIDE-FORMOTEROL FUMARATE 160-4.5 MCG/ACT IN AERO: 2.0000 | INHALATION_SPRAY | Freq: Two times a day (BID) | RESPIRATORY_TRACT | Status: DC

## 2011-10-25 ENCOUNTER — Ambulatory Visit (INDEPENDENT_AMBULATORY_CARE_PROVIDER_SITE_OTHER): Payer: Medicare Other | Admitting: Internal Medicine

## 2011-10-25 ENCOUNTER — Ambulatory Visit (INDEPENDENT_AMBULATORY_CARE_PROVIDER_SITE_OTHER)
Admission: RE | Admit: 2011-10-25 | Discharge: 2011-10-25 | Disposition: A | Discharge status: Home / Self Care | Payer: Medicare Other | Source: Ambulatory Visit | Attending: Internal Medicine | Admitting: Internal Medicine

## 2011-10-25 ENCOUNTER — Encounter: Payer: Self-pay | Admitting: Internal Medicine

## 2011-10-25 VITALS — BP 158/86 | HR 70 | Ht 63.0 in | Wt 125.6 lb

## 2011-10-25 DIAGNOSIS — J310 Chronic rhinitis: Secondary | ICD-10-CM

## 2011-10-25 DIAGNOSIS — J42 Unspecified chronic bronchitis: Secondary | ICD-10-CM

## 2011-10-25 MED ORDER — ALBUTEROL SULFATE HFA 108 (90 BASE) MCG/ACT IN AERS: 2.0000 | INHALATION_SPRAY | RESPIRATORY_TRACT | Status: DC | PRN

## 2011-10-25 MED ORDER — IPRATROPIUM BROMIDE 0.06 % NA SOLN: NASAL | Status: AC

## 2011-10-25 MED ORDER — BUDESONIDE-FORMOTEROL FUMARATE 160-4.5 MCG/ACT IN AERO: 2.0000 | INHALATION_SPRAY | Freq: Two times a day (BID) | RESPIRATORY_TRACT | Status: DC

## 2011-10-25 NOTE — Patient Instructions (Addendum)
Scripts sent to refill rescue albuterol inhaler and Symbicort for the next year  Script sent to try ipratropium nasal spray, as needed, for watery, runny nose  Order  CXR     Dx chronic bronchitis

## 2011-10-25 NOTE — Progress Notes (Signed)
Patient ID: Rhonda Moore, female    DOB: [birth year], 78 y.o.   MRN: [id number]  HPI 10/24/10 - 23 yoF former smoker followed for COPD Last here July 24, 2010- note reviewed CXR 07/24/10- compared back to July, 2011 shows persistent density c/w scar left infrahilum, improved. I discussed this with her.  Feels more energy. Thinks she may be a little more short of breath in last few weeks with cough productive white/ yellow to clear. It is looser now.   04/26/11- 84 yoF former smoker followed for COPD Breathing varies, but ok. Had flu shot. Chokes in evening, before supper. Symbicort increased to 160- now sufficient. Still coughing yellow.   10/25/11- 87 yoF former smoker followed for COPD Still c/o cough with yellow mucus, mild chest pain, and wheezing at night.  Denies chest tightness Continue Symbicort daily and uses rescue inhaler only occasionally. Her son lives with her. She can't exert enough to keep the house for the way she wants to do anymore. CXR 07/24/10-  IMPRESSION:  Chronic right infrahilar density, likely in the right middle lobe.  Question scarring. Cannot completely exclude mass. If further  evaluation is felt warranted, CT with contrast may be beneficial.  Stable cardiomegaly.  Original Report Authenticated By: Cyndie Chime, M.D.   Review of Systems-see HPI Constitutional:   No-   weight loss, night sweats, fevers, chills, fatigue, lassitude. HEENT:   No-  headaches, difficulty swallowing, tooth/dental problems, sore throat,       No-  sneezing, itching, ear ache, nasal congestion, +post nasal drip,  CV:  No-   chest pain, orthopnea, PND, swelling in lower extremities, anasarca, dizziness, palpitations Resp: +  shortness of breath with exertion or at rest.               +productive cough,  No non-productive cough,  No- coughing up of blood.              No-   change in color of mucus.  No- wheezing.   Skin: No-   rash or lesions. GI:  No-   heartburn, indigestion,  abdominal pain, nausea, vomiting,  GU MS:  No-   joint pain or swelling.   Neuro-     nothing unusual Psych:  No- change in mood or affect. No depression or anxiety.  No memory loss.      Objective:   Physical Exam General- Alert, Oriented, Affect-appropriate, Distress- none acute Skin- rash-none, lesions- none, excoriation- none Lymphadenopathy- none Head- atraumatic            Eyes- Gross vision intact, PERRLA, conjunctivae clear secretions            Ears- Hearing, canals-normal            Nose- Clear, no-Septal dev, mucus, polyps, erosion, perforation             Throat- Mallampati II , mucosa clear , drainage- none, tonsils- atrophic Neck- flexible , trachea midline, no stridor , thyroid nl, carotid no bruit Chest - symmetrical excursion , unlabored           Heart/CV- RRR , no murmur , no gallop  , no rub, nl s1 s2                           - JVD- none , edema- none, stasis changes- none, varices- none           Lung- mild  rhonchi, wheeze- none, cough- none , dullness-none, rub- none           Chest wall-  Abd- tender-no, distended-no, bowel sounds-present, HSM- no Br/ Gen/ Rectal- Not done, not indicated Extrem- cyanosis- none, clubbing, none, atrophy- none, strength- nl Neuro- grossly intact to observation

## 2011-10-26 NOTE — Progress Notes (Signed)
Quick Note:  Pt aware of results. ______ 

## 2011-11-02 DIAGNOSIS — J310 Chronic rhinitis: Secondary | ICD-10-CM | POA: Insufficient documentation

## 2011-11-02 NOTE — Assessment & Plan Note (Signed)
Symbicort provides fair control of wheezing and produces cough but she remains dyspneic and weak with exertion. No acute event. She is feeling strain of trying to maintain the household for her son

## 2011-11-02 NOTE — Assessment & Plan Note (Signed)
Bothersome drainage might respond to ipratropium which we discussed.

## 2012-04-01 ENCOUNTER — Other Ambulatory Visit: Payer: Self-pay | Admitting: Sports Medicine

## 2012-04-01 DIAGNOSIS — M545 Low back pain: Secondary | ICD-10-CM

## 2012-04-05 ENCOUNTER — Ambulatory Visit
Admission: RE | Admit: 2012-04-05 | Discharge: 2012-04-05 | Disposition: A | Discharge status: Home / Self Care | Payer: Medicare Other | Source: Ambulatory Visit | Attending: Sports Medicine | Admitting: Sports Medicine

## 2012-04-05 DIAGNOSIS — M545 Low back pain: Secondary | ICD-10-CM

## 2012-04-05 MED ORDER — GADOBENATE DIMEGLUMINE 529 MG/ML IV SOLN
10.0000 mL | Freq: Once | INTRAVENOUS | Status: AC | PRN
  Administered 2012-04-05: 10 mL via INTRAVENOUS

## 2012-04-24 ENCOUNTER — Ambulatory Visit: Payer: Medicare Other | Admitting: Internal Medicine

## 2012-05-29 ENCOUNTER — Ambulatory Visit (INDEPENDENT_AMBULATORY_CARE_PROVIDER_SITE_OTHER)
Admission: RE | Admit: 2012-05-29 | Discharge: 2012-05-29 | Disposition: A | Discharge status: Home / Self Care | Payer: Medicare Other | Source: Ambulatory Visit | Attending: Internal Medicine | Admitting: Internal Medicine

## 2012-05-29 ENCOUNTER — Ambulatory Visit (INDEPENDENT_AMBULATORY_CARE_PROVIDER_SITE_OTHER): Payer: Medicare Other | Admitting: Internal Medicine

## 2012-05-29 ENCOUNTER — Encounter: Payer: Self-pay | Admitting: Internal Medicine

## 2012-05-29 VITALS — BP 120/76 | HR 71 | Ht 63.0 in | Wt 122.2 lb

## 2012-05-29 DIAGNOSIS — J449 Chronic obstructive pulmonary disease, unspecified: Secondary | ICD-10-CM

## 2012-05-29 DIAGNOSIS — J42 Unspecified chronic bronchitis: Secondary | ICD-10-CM

## 2012-05-29 NOTE — Patient Instructions (Addendum)
Order- CXR  Dx COPD   

## 2012-05-29 NOTE — Progress Notes (Signed)
Patient ID: Rhonda Moore, female    DOB: Feb 03, 1926, 79 y.o.   MRN: [id number]  HPI 10/24/10 - 18 yoF former smoker followed for COPD Last here July 24, 2010- note reviewed CXR 07/24/10- compared back to July, 2011 shows persistent density c/w scar left infrahilum, improved. I discussed this with her.  Feels more energy. Thinks she may be a little more short of breath in last few weeks with cough productive white/ yellow to clear. It is looser now.   04/26/11- 71 yoF former smoker followed for COPD Breathing varies, but ok. Had flu shot. Chokes in evening, before supper. Symbicort increased to 160- now sufficient. Still coughing yellow.   10/25/11- 34 yoF former smoker followed for COPD Still c/o cough with yellow mucus, mild chest pain, and wheezing at night.  Denies chest tightness Continue Symbicort daily and uses rescue inhaler only occasionally. Her son lives with her. She can't exert enough to keep the house for the way she wants to do anymore. CXR 07/24/10-  IMPRESSION:  Chronic right infrahilar density, likely in the right middle lobe.  Question scarring. Cannot completely exclude mass. If further  evaluation is felt warranted, CT with contrast may be beneficial.  Stable cardiomegaly.  Original Report Authenticated By: Cyndie Chime, M.D.   05/29/12- 42 yoF former smoker followed for COPD FOLLOWS FOR: starting to have cough at night more than usual-trouble getting mucus up. Cough productive of some clear phlegm. She is not concerned. Had a gastroenteritis but no respiratory viral syndrome. Complains of persistent weakness. CXR 10/26/11 IMPRESSION:  Enlargement of cardiac silhouette.  Chronic right middle lobe opacity adjacent to right heart border,  essentially unchanged since 07/24/2010.  While this could be due to infiltrate, scarring or mass, the lack  of interval change favors a benign process.  Either continued radiographic assessment demonstrate long-term  stability or  computed tomography recommended to exclude underlying  mass.  Original Report Authenticated By: Lollie Marrow, M.D.   Review of Systems-see HPI Constitutional:   No-   weight loss, night sweats, fevers, chills, fatigue, lassitude. HEENT:   No-  headaches, difficulty swallowing, tooth/dental problems, sore throat,       No-  sneezing, itching, ear ache, nasal congestion, +post nasal drip,  CV:  No-   chest pain, orthopnea, PND, swelling in lower extremities, anasarca, dizziness, palpitations Resp: +  shortness of breath with exertion or at rest.               +productive cough,  No non-productive cough,  No- coughing up of blood.              No-   change in color of mucus.  No- wheezing.   Skin: No-   rash or lesions. GI:  No-   heartburn, indigestion, abdominal pain, nausea, vomiting,  GU MS:  No-   joint pain or swelling.   Neuro-     nothing unusual Psych:  No- change in mood or affect. No depression or anxiety.  No memory loss.  Objective:   Physical Exam General- Alert, Oriented, Affect-appropriate, Distress- none acute, elderly, thin Skin- rash-none, lesions- none, excoriation- none Lymphadenopathy- none Head- atraumatic            Eyes- Gross vision intact, PERRLA, conjunctivae clear secretions            Ears- Hearing, canals-normal            Nose- Clear, no-Septal dev, mucus, polyps, erosion, perforation  Throat- Mallampati II , mucosa clear , drainage- none, tonsils- atrophic Neck- flexible , trachea midline, no stridor , thyroid nl, carotid no bruit Chest - symmetrical excursion , unlabored           Heart/CV- RRR , no murmur , no gallop  , no rub, nl s1 s2                           - JVD- none , edema- none, stasis changes- none, varices- none           Lung- clear, wheeze- none, cough- none , dullness-none, rub- none           Chest wall-  Abd-  Br/ Gen/ Rectal- Not done, not indicated Extrem- cyanosis- none, clubbing, none, atrophy- none, strength-  nl Neuro- grossly intact to observation

## 2012-06-08 NOTE — Assessment & Plan Note (Signed)
Minor cough does not bother her. Mild cardiac enlargement has not seemed to indicate heart failure and she denies choking or cough while eating. We will update chest x-ray to look again at the right middle lobe density. Plan -chest x-ray

## 2012-09-19 ENCOUNTER — Ambulatory Visit (INDEPENDENT_AMBULATORY_CARE_PROVIDER_SITE_OTHER): Payer: Medicare Other | Admitting: Internal Medicine

## 2012-09-19 ENCOUNTER — Encounter: Payer: Self-pay | Admitting: Internal Medicine

## 2012-09-19 VITALS — BP 120/80 | HR 83 | Ht 63.0 in | Wt 123.2 lb

## 2012-09-19 DIAGNOSIS — J42 Unspecified chronic bronchitis: Secondary | ICD-10-CM

## 2012-09-19 MED ORDER — METHYLPREDNISOLONE ACETATE 80 MG/ML IJ SUSP
80.0000 mg | Freq: Once | INTRAMUSCULAR | Status: AC
  Administered 2012-09-19: 80 mg via INTRAMUSCULAR

## 2012-09-19 MED ORDER — LEVALBUTEROL HCL 0.63 MG/3ML IN NEBU
0.6300 mg | INHALATION_SOLUTION | Freq: Once | RESPIRATORY_TRACT | Status: AC
  Administered 2012-09-19: 0.63 mg via RESPIRATORY_TRACT

## 2012-09-19 NOTE — Progress Notes (Signed)
Patient ID: Rhonda Moore, female    DOB: January 04, 1926, 79 y.o.   MRN: [id number]  HPI 10/24/10 - 82 yoF former smoker followed for COPD Last here July 24, 2010- note reviewed CXR 07/24/10- compared back to July, 2011 shows persistent density c/w scar left infrahilum, improved. I discussed this with her.  Feels more energy. Thinks she may be a little more short of breath in last few weeks with cough productive white/ yellow to clear. It is looser now.   04/26/11- 9 yoF former smoker followed for COPD Breathing varies, but ok. Had flu shot. Chokes in evening, before supper. Symbicort increased to 160- now sufficient. Still coughing yellow.   10/25/11- 15 yoF former smoker followed for COPD Still c/o cough with yellow mucus, mild chest pain, and wheezing at night.  Denies chest tightness Continue Symbicort daily and uses rescue inhaler only occasionally. Her son lives with her. She can't exert enough to keep the house for the way she wants to do anymore. CXR 07/24/10-  IMPRESSION:  Chronic right infrahilar density, likely in the right middle lobe.  Question scarring. Cannot completely exclude mass. If further  evaluation is felt warranted, CT with contrast may be beneficial.  Stable cardiomegaly.  Original Report Authenticated By: Cyndie Chime, M.D.   05/29/12- 33 yoF former smoker followed for COPD FOLLOWS FOR: starting to have cough at night more than usual-trouble getting mucus up. Cough productive of some clear phlegm. She is not concerned. Had a gastroenteritis but no respiratory viral syndrome. Complains of persistent weakness. CXR 10/26/11 IMPRESSION:  Enlargement of cardiac silhouette.  Chronic right middle lobe opacity adjacent to right heart border,  essentially unchanged since 07/24/2010.  While this could be due to infiltrate, scarring or mass, the lack  of interval change favors a benign process.  Either continued radiographic assessment demonstrate long-term  stability or  computed tomography recommended to exclude underlying  mass.  Original Report Authenticated By: Lollie Marrow, M.D.  09/19/12- 42 yoF former smoker followed for COPD FOLLOWS FOR: Increased SOB, wheezing slight cough(mostly at night and in am). Pain in left side and around back-happens with breathing and or moving. Son here- smoker. Past month has had pain in the left lateral chest. Head CT scan of the chest at [facility] yesterday, which she understands showed "a touch of pneumonia in the left lung". She does have a followup visit scheduled with her primary care provider to recheck this. She has not recognized fever or discolored sputum.  Review of Systems-see HPI Constitutional:   No-   weight loss, night sweats, fevers, chills, fatigue, lassitude. HEENT:   No-  headaches, difficulty swallowing, tooth/dental problems, sore throat,       No-  sneezing, itching, ear ache, nasal congestion, +post nasal drip,  CV:  No-   chest pain, orthopnea, PND, swelling in lower extremities, anasarca, dizziness, palpitations Resp: +  shortness of breath with exertion or at rest.               +productive cough,  No non-productive cough,  No- coughing up of blood.              No-   change in color of mucus.  No- wheezing.   Skin: No-   rash or lesions. GI:  No-   heartburn, indigestion, abdominal pain, nausea, vomiting,  GU MS:  No-   joint pain or swelling.   Neuro-     nothing unusual Psych:  No- change  in mood or affect. No depression or anxiety.  No memory loss.  Objective:   Physical Exam General- Alert, Oriented, Affect-appropriate, Distress- none acute, elderly, thin Skin- rash-none, lesions- none, excoriation- none Lymphadenopathy- none Head- atraumatic            Eyes- Gross vision intact, PERRLA, conjunctivae clear secretions            Ears- Hearing, canals-normal            Nose- Clear, no-Septal dev, mucus, polyps, erosion, perforation             Throat- Mallampati II , mucosa clear ,  drainage- none, tonsils- atrophic Neck- flexible , trachea midline, no stridor , thyroid nl, carotid no bruit Chest - symmetrical excursion , unlabored           Heart/CV- RRR , no murmur , no gallop  , no rub, nl s1 s2                           - JVD- none , edema- none, stasis changes- none, varices- none           Lung- clear, wheeze+ L>R, cough- none , dullness-none, rub- none           Chest wall-  Abd-  Br/ Gen/ Rectal- Not done, not indicated Extrem- cyanosis- none, clubbing, none, atrophy- none, strength- nl Neuro- grossly intact to observation

## 2012-09-19 NOTE — Patient Instructions (Addendum)
Keep appointment with your primary physician on Monday to go over the CT scan.   Neb xop 0.63  Depo 80

## 2012-09-30 NOTE — Assessment & Plan Note (Signed)
Exacerbation of COPD with atypical chest pain probably result of coughing. Plan-keep appointment with primary care physician to followup after pneumonia, indicated by findings on chest CT done at [facility]

## 2012-11-27 ENCOUNTER — Ambulatory Visit: Payer: Medicare Other | Admitting: Internal Medicine

## 2012-12-05 ENCOUNTER — Telehealth: Payer: Self-pay | Admitting: Internal Medicine

## 2012-12-05 DIAGNOSIS — J42 Unspecified chronic bronchitis: Secondary | ICD-10-CM

## 2012-12-05 MED ORDER — BUDESONIDE-FORMOTEROL FUMARATE 160-4.5 MCG/ACT IN AERO: 2.0000 | INHALATION_SPRAY | Freq: Two times a day (BID) | RESPIRATORY_TRACT | Status: DC

## 2012-12-05 NOTE — Telephone Encounter (Signed)
I spoke with pt son and aware RX has been sent. Nothing further was needed

## 2013-03-23 ENCOUNTER — Ambulatory Visit (INDEPENDENT_AMBULATORY_CARE_PROVIDER_SITE_OTHER): Payer: Medicare Other | Admitting: Internal Medicine

## 2013-03-23 ENCOUNTER — Encounter: Payer: Self-pay | Admitting: Internal Medicine

## 2013-03-23 VITALS — BP 124/74 | HR 74 | Ht 63.0 in | Wt 123.6 lb

## 2013-03-23 DIAGNOSIS — J449 Chronic obstructive pulmonary disease, unspecified: Secondary | ICD-10-CM

## 2013-03-23 DIAGNOSIS — J4489 Other specified chronic obstructive pulmonary disease: Secondary | ICD-10-CM

## 2013-03-23 NOTE — Patient Instructions (Signed)
Please call as needed 

## 2013-03-23 NOTE — Progress Notes (Signed)
Patient ID: TALISE SLIGH, female    DOB: [birth year], 79 y.o.   MRN: [id number]  HPI 10/24/10 - 74 yoF former smoker followed for COPD Last here July 24, 2010- note reviewed CXR 07/24/10- compared back to July, 2011 shows persistent density c/w scar left infrahilum, improved. I discussed this with her.  Feels more energy. Thinks she may be a little more short of breath in last few weeks with cough productive white/ yellow to clear. It is looser now.   04/26/11- 81 yoF former smoker followed for COPD Breathing varies, but ok. Had flu shot. Chokes in evening, before supper. Symbicort increased to 160- now sufficient. Still coughing yellow.   10/25/11- 54 yoF former smoker followed for COPD Still c/o cough with yellow mucus, mild chest pain, and wheezing at night.  Denies chest tightness Continue Symbicort daily and uses rescue inhaler only occasionally. Her son lives with her. She can't exert enough to keep the house for the way she wants to do anymore. CXR 07/24/10-  IMPRESSION:  Chronic right infrahilar density, likely in the right middle lobe.  Question scarring. Cannot completely exclude mass. If further  evaluation is felt warranted, CT with contrast may be beneficial.  Stable cardiomegaly.  Original Report Authenticated By: Cyndie Chime, M.D.   05/29/12- 62 yoF former smoker followed for COPD FOLLOWS FOR: starting to have cough at night more than usual-trouble getting mucus up. Cough productive of some clear phlegm. She is not concerned. Had a gastroenteritis but no respiratory viral syndrome. Complains of persistent weakness. CXR 10/26/11 IMPRESSION:  Enlargement of cardiac silhouette.  Chronic right middle lobe opacity adjacent to right heart border,  essentially unchanged since 07/24/2010.  While this could be due to infiltrate, scarring or mass, the lack  of interval change favors a benign process.  Either continued radiographic assessment demonstrate long-term  stability or  computed tomography recommended to exclude underlying  mass.  Original Report Authenticated By: Lollie Marrow, M.D.  09/19/12- 39 yoF former smoker followed for COPD FOLLOWS FOR: Increased SOB, wheezing slight cough(mostly at night and in am). Pain in left side and around back-happens with breathing and or moving. Son here- smoker. Past month has had pain in the left lateral chest. Head CT scan of the chest at [facility] yesterday, which she understands showed "a touch of pneumonia in the left lung". She does have a followup visit scheduled with her primary care provider to recheck this. She has not recognized fever or discolored sputum.  03/23/13- 43 yoF former smoker followed for COPD Follows for-rov.  Pt c/o mild congestion in throat, feels like she can't get up mucus. CT chest elsewhere 09/22/12- suspected mild atypical infection/ ?MAIC, atherosclerosis Feels the best she has in a long time. We discussed a chest CT she had done at [facility] in May during workup for aneurysm. She denies night sweats or fever, adenopathy or blood in sputum. Little cough. No dysphagia.  Review of Systems-see HPI Constitutional:   No-   weight loss, night sweats, fevers, chills, fatigue, lassitude. HEENT:   No-  headaches, difficulty swallowing, tooth/dental problems, sore throat,       No-  sneezing, itching, ear ache, nasal congestion, +post nasal drip,  CV:  No-   chest pain, orthopnea, PND, swelling in lower extremities, anasarca, dizziness, palpitations Resp: +  shortness of breath with exertion or at rest.               +productive cough,  No non-productive  cough,  No- coughing up of blood.              No-   change in color of mucus.  No- wheezing.   Skin: No-   rash or lesions. GI:  No-   heartburn, indigestion, abdominal pain, nausea, vomiting,  GU MS:  No-   joint pain or swelling.   Neuro-     nothing unusual Psych:  No- change in mood or affect. No depression or anxiety.  No memory  loss.  Objective:   Physical Exam General- Alert, Oriented, Affect-appropriate, Distress- none acute, elderly, thin Skin- rash-none, lesions- none, excoriation- none Lymphadenopathy- none Head- atraumatic            Eyes- Gross vision intact, PERRLA, conjunctivae clear secretions            Ears- Hearing, canals-normal            Nose- Clear, no-Septal dev, mucus, polyps, erosion, perforation             Throat- Mallampati II , mucosa clear , drainage- none, tonsils- atrophic Neck- flexible , trachea midline, no stridor , thyroid nl, carotid no bruit Chest - symmetrical excursion , unlabored           Heart/CV- RRR , no murmur , no gallop  , no rub, nl s1 s2                           - JVD- none , edema- none, stasis changes- none, varices- none           Lung- clear, wheeze-none, cough- none , dullness-none, rub- none           Chest wall-  Abd-  Br/ Gen/ Rectal- Not done, not indicated Extrem- cyanosis- none, clubbing, none, atrophy- none, strength- nl Neuro- grossly intact to observation

## 2013-04-08 NOTE — Assessment & Plan Note (Signed)
MAIC WOULD not be surprising. She is not symptomatic to suggest active disease. We discussed this and agreed to follow conservatively unless her status changes.

## 2013-09-23 ENCOUNTER — Emergency Department (HOSPITAL_COMMUNITY): Payer: Medicare HMO

## 2013-09-23 ENCOUNTER — Emergency Department (HOSPITAL_COMMUNITY)
Admission: EM | Admit: 2013-09-23 | Discharge: 2013-09-23 | Disposition: A | Discharge status: Home / Self Care | Payer: Medicare HMO | Attending: Emergency Medicine | Admitting: Emergency Medicine

## 2013-09-23 ENCOUNTER — Encounter (HOSPITAL_COMMUNITY): Payer: Self-pay | Admitting: Emergency Medicine

## 2013-09-23 DIAGNOSIS — Z862 Personal history of diseases of the blood and blood-forming organs and certain disorders involving the immune mechanism: Secondary | ICD-10-CM | POA: Insufficient documentation

## 2013-09-23 DIAGNOSIS — Z87891 Personal history of nicotine dependence: Secondary | ICD-10-CM | POA: Insufficient documentation

## 2013-09-23 DIAGNOSIS — Y9389 Activity, other specified: Secondary | ICD-10-CM | POA: Insufficient documentation

## 2013-09-23 DIAGNOSIS — R296 Repeated falls: Secondary | ICD-10-CM | POA: Insufficient documentation

## 2013-09-23 DIAGNOSIS — Z7982 Long term (current) use of aspirin: Secondary | ICD-10-CM | POA: Insufficient documentation

## 2013-09-23 DIAGNOSIS — I10 Essential (primary) hypertension: Secondary | ICD-10-CM | POA: Insufficient documentation

## 2013-09-23 DIAGNOSIS — Z96649 Presence of unspecified artificial hip joint: Secondary | ICD-10-CM | POA: Insufficient documentation

## 2013-09-23 DIAGNOSIS — Y929 Unspecified place or not applicable: Secondary | ICD-10-CM | POA: Insufficient documentation

## 2013-09-23 DIAGNOSIS — T84029A Dislocation of unspecified internal joint prosthesis, initial encounter: Secondary | ICD-10-CM | POA: Insufficient documentation

## 2013-09-23 DIAGNOSIS — S73005A Unspecified dislocation of left hip, initial encounter: Secondary | ICD-10-CM

## 2013-09-23 DIAGNOSIS — Z8639 Personal history of other endocrine, nutritional and metabolic disease: Secondary | ICD-10-CM | POA: Insufficient documentation

## 2013-09-23 DIAGNOSIS — Z8709 Personal history of other diseases of the respiratory system: Secondary | ICD-10-CM | POA: Insufficient documentation

## 2013-09-23 DIAGNOSIS — Z8619 Personal history of other infectious and parasitic diseases: Secondary | ICD-10-CM | POA: Insufficient documentation

## 2013-09-23 DIAGNOSIS — Z79899 Other long term (current) drug therapy: Secondary | ICD-10-CM | POA: Insufficient documentation

## 2013-09-23 DIAGNOSIS — Z880 Allergy status to penicillin: Secondary | ICD-10-CM | POA: Insufficient documentation

## 2013-09-23 MED ORDER — PROPOFOL 10 MG/ML IV BOLUS
0.5000 mg/kg | Freq: Once | INTRAVENOUS | Status: AC
  Administered 2013-09-23: 60 mg via INTRAVENOUS
  Filled 2013-09-23: qty 1

## 2013-09-23 MED ORDER — HYDROCODONE-ACETAMINOPHEN 5-325 MG PO TABS: 0.5000 | ORAL_TABLET | ORAL | Status: DC | PRN

## 2013-09-23 NOTE — ED Notes (Signed)
MD at bedside. 

## 2013-09-23 NOTE — ED Provider Notes (Addendum)
CSN: [id number]     Arrival date & time 09/23/13  1322 History   First MD Initiated Contact with Patient 09/23/13 1324     Chief Complaint  Patient presents with  . Fall  . Hip Pain     (Consider location/radiation/quality/duration/timing/severity/associated sxs/prior Treatment) HPI Comments: Patient presents to the ER for evaluation of left hip injury. Patient reports that she bent over and lost her balance, and fell backwards. She landed in a seated position. She did not lose consciousness. No neck or back pain. Patient is complaining of left hip pain since the fall. She was unable to get up off the floor by herself. She is brought to the ER by EMS complaining of pain in the hip. She had some pain in the left shoulder earlier, now gone after she got IV medicines by EMS.  Patient is a 80 y.o. female presenting with fall and hip pain.  Fall  Hip Pain    Past Medical History  Diagnosis Date  . Hypertension   . Palpitation   . Hyperlipidemia   . Cardiomegaly   . Bronchitis   . Shingles 2007   Past Surgical History  Procedure Laterality Date  . Cervical spine surgery      x 2  . Lumbar spine surgery      x 2  . Shoulder surgery    . Left hip replacement     No family history on file. History  Substance Use Topics  . Smoking status: Former Smoker    Types: Cigarettes    Quit date: 05/07/1948  . Smokeless tobacco: Not on file  . Alcohol Use: Not on file   OB History   Grav Para Term Preterm Abortions TAB SAB Ect Mult Living                 Review of Systems    Allergies  Penicillins  Home Medications   Prior to Admission medications   Medication Sig Start Date End Date Taking? Authorizing Provider  amLODipine (NORVASC) 5 MG tablet Take 1 tablet by mouth daily. 05/14/12   Historical Provider, MD  aspirin 81 MG tablet Take 81 mg by mouth daily.      Historical Provider, MD  atenolol (TENORMIN) 50 MG tablet Take 50 mg by mouth 2 (two) times daily.     Historical  Provider, MD  budesonide-formoterol (SYMBICORT) 160-4.5 MCG/ACT inhaler Inhale 2 puffs into the lungs 2 (two) times daily. Rinse mouth 12/05/12 12/05/13  Waymon Budgelinton D Young, MD  citalopram (CELEXA) 40 MG tablet Take 40 mg by mouth daily.      Historical Provider, MD  ipratropium (ATROVENT) 0.06 % nasal spray 1-2 sprays each nostril, up to 4 times daily as needed 10/25/11 03/23/13  Waymon Budgelinton D Young, MD  losartan (COZAAR) 50 MG tablet Take 1 tablet by mouth daily. 05/26/12   Historical Provider, MD  traMADol (ULTRAM) 50 MG tablet Take 1 tablet by mouth Twice daily. 04/24/12   Historical Provider, MD   BP 141/69  Pulse 72  Temp(Src) 97.9 F (36.6 C) (Oral)  Resp 18  Ht 5\' 2"  (1.575 m)  Wt 125 lb (56.7 kg)  BMI 22.86 kg/m2  SpO2 95% Physical Exam  Constitutional: She is oriented to person, place, and time. She appears well-developed and well-nourished. No distress.  HENT:  Head: Normocephalic and atraumatic.  Right Ear: Hearing normal.  Left Ear: Hearing normal.  Nose: Nose normal.  Mouth/Throat: Oropharynx is clear and moist and mucous membranes are normal.  Eyes: Conjunctivae and EOM are normal. Pupils are equal, round, and reactive to light.  Neck: Normal range of motion. Neck supple.  Cardiovascular: Regular rhythm, S1 normal and S2 normal.  Exam reveals no gallop and no friction rub.   No murmur heard. Pulmonary/Chest: Effort normal and breath sounds normal. No respiratory distress. She exhibits no tenderness.  Abdominal: Soft. Normal appearance and bowel sounds are normal. There is no hepatosplenomegaly. There is no tenderness. There is no rebound, no guarding, no tenderness at McBurney's point and negative Murphy's sign. No hernia.  Musculoskeletal:       Left hip: She exhibits decreased range of motion, tenderness and deformity.  Neurological: She is alert and oriented to person, place, and time. She has normal strength. No cranial nerve deficit or sensory deficit. Coordination normal. GCS  eye subscore is 4. GCS verbal subscore is 5. GCS motor subscore is 6.  Skin: Skin is warm, dry and intact. No rash noted. No cyanosis.  Psychiatric: She has a normal mood and affect. Her speech is normal and behavior is normal. Thought content normal.    ED Course  Procedures (including critical care time)  Procedure: Conscious Sedation Patient was placed on suplemental oxygen as well as continuous cardiac and pulse oximetry monitoring. Patient was administered medications under direct supervision by myself. Excellent sedation was achieved. Patient's vital signs remained stable. The patient was continuously monitored during the recovery phase. The patient tolerated the sedation without complication. Sedation from 15:50 to 16:15, .  Procedure: Closed reduction left hip The patient was placed in supine position. The hip was flexed to 90 and longitudinal traction was placed along the femur. Pelvis was stabilized at the bed. Gentle internal and external rotation of the femur was applied while longitudinal traction was applied. There was a positive reduction felt. Post procedure x-ray confirms reduction.  Labs Review Labs Reviewed - No data to display  Imaging Review Dg Thoracic Spine 2 View  09/23/2013   CLINICAL DATA:  Traumatic injury with pain  EXAM: THORACIC SPINE - 2 VIEW  COMPARISON:  None.  FINDINGS: Generalized osteopenia is identified. No definitive compression deformities are seen. Mild degenerative changes are noted. Postsurgical changes in the lower cervical spine are noted.  IMPRESSION: Chronic changes without acute abnormality.   Electronically Signed   By: Alcide Clever M.D.   On: 09/23/2013 14:42   Dg Lumbar Spine Complete  09/23/2013   CLINICAL DATA:  Recent traumatic injury with pain  EXAM: LUMBAR SPINE - COMPLETE 4+ VIEW  COMPARISON:  None.  FINDINGS: Five lumbar type vertebral bodies are well visualized. No compression deformities are noted. Grade 1 anterolisthesis is noted  of L4 on L5. This is felt to be of a degenerative nature is no definitive pars defects are seen. No acute bony abnormality is noted. There is dislocation of the left femoral prosthesis as previously described.  IMPRESSION: Left hip prosthesis dislocation.  Degenerative changes of the lumbar spine.   Electronically Signed   By: Alcide Clever M.D.   On: 09/23/2013 14:36   Dg Hip Complete Left  09/23/2013   CLINICAL DATA:  Recent traumatic injury with left hip pain  EXAM: LEFT HIP - COMPLETE 2+ VIEW  COMPARISON:  None.  FINDINGS: There is evidence of a left hip prosthesis. The femoral component has dislocated from the acetabular component and lies superior and posterior to the acetabular component. No acute fracture or loosening is noted. No other focal abnormality is seen.  IMPRESSION: Superior and posterior  dislocation of the femoral component of the left hip prosthesis.   Electronically Signed   By: Alcide CleverMark  Lukens M.D.   On: 09/23/2013 14:32     EKG Interpretation None      MDM   Final diagnoses:  Hip dislocation, left    Patient presents to the ER for evaluation after a fall. She is complaining of left hip pain. Patient does have a history of left hip replacement approximately 15 years ago by Doctor Thurston HoleWainer. She thinks it has been dislocated in the past. Examination revealed shortening of the left leg compared to the right. She has limited range of motion at the hip. X-ray confirmed dislocation without fracture. Remainder of the x-rays of the spine were negative. Patient initially complained of left shoulder pain, declined the x-ray. There was no history of head injury, fell into a seated position. No concern for intracranial injury. No neuro imaging necessary.  I recommend a closed reduction of the left hip. Procedure, risks and benefits were discussed with the patient and her son. They did give consent after discussion. Pupils reduced with moderate sedation here in the ER. Patient placed in left  knee immobilizer, will followup with orthopedics.    Gilda Creasehristopher J. Mathhew Buysse, MD 09/23/13 1617  Gilda Creasehristopher J. Srijan Givan, MD 10/06/13 2106

## 2013-09-23 NOTE — ED Notes (Signed)
MD at bedside. EDP POLLINA  In to reevaluation pt. Ortho here to apply immobilizer to left leg.

## 2013-09-23 NOTE — Discharge Instructions (Signed)
Hip Dislocation  Hip dislocation is the displacement of the "ball" at the head of your thigh bone (femur) from its socket in the hip bone (pelvis). The ball-and-socket structure of the hip joint gives it a lot of stability, while allowing it to move freely. Therefore, a lot of force is required to displace the femur from its socket. A hip dislocation is an emergency. If you believe you have dislocated your hip and cannot move your leg, call for help immediately. Do not try to move.  CAUSES  The most common cause of hip dislocation is motor vehicle accidents. However, force from falls from a height (a ladder or building), injuries from contact sports, or injuries from industrial accidents can be enough to dislocate your hip.  SYMPTOMS  A hip dislocation is very painful. If you have a dislocated hip, you will not be able to move your hip. If you have nerve damage, you may not have feeling in your lower leg, foot, or ankle.   DIAGNOSIS  Usually, your caregiver can diagnose a hip dislocation by looking at the position of your leg. Generally, X-ray exams are done to check for fractures in your femur or pelvis. The leg of the dislocated hip will appear shorter than the other leg, and your foot will be turned inward.  TREATMENT   Your caregiver can manipulate your bones back into the joint (reduction). If there are no other complications involved with your dislocation, such as fractures or damage to blood vessels or nerves, this procedure can be done without surgery. Before this procedure, you will be given medicine so that you will not feel pain (anesthetic). Often specialized imaging exams are done after the reduction (magnetic resonance imaging [MRI] or computed tomography [CT]) to check for loose pieces of cartilage or bone in the joint.  If a manual reduction fails or you have nerve damage, damage to your blood vessels, or bone fractures, surgery will be necessary to perform the reduction.   HOME CARE  INSTRUCTIONS  The following measures can help to reduce pain and speed up the healing process:  · Rest your injured joint. Do not move your joint if it is painful. Also, avoid activities similar to the one that caused your injury.  · Apply ice to your injured joint for 1 to 2 days after your reduction or as directed by your caregiver. Applying ice helps to reduce inflammation and pain.  · Put ice in a plastic bag.  · Place a towel between your skin and the bag.  · Leave the ice on for 15 to 20 minutes at a time, every 2 hours while you are awake.  · Use crutches or a walker as directed by your caregiver.  · Exercise your hip and leg as directed by your caregiver.  · Take over-the-counter or prescription medicine for pain as directed by your caregiver.  SEEK IMMEDIATE MEDICAL CARE IF:  · Your pain becomes worse rather than better.  · You feel like your hip has become dislocated again.  MAKE SURE YOU:  · Understand these instructions.  · Will watch your condition.  · Will get help right away if you are not doing well or get worse.  Document Released: 01/16/2001 Document Revised: 07/16/2011 Document Reviewed: 09/21/2010  [employer]® Patient Information ©2014 [employer].

## 2013-09-23 NOTE — ED Notes (Signed)
Family at bedside. 

## 2013-09-23 NOTE — ED Notes (Signed)
EDP Pollina ordered dose

## 2013-09-23 NOTE — ED Notes (Signed)
Per [facility]- Pt reside at home. Pt reports loss of balance and fell backwards landing of buttocks . NO LOC. No neck or back or back pain. Pt was found in chair.Pt c/o of left shoulder and hip pain. Family present at house yet did not here her fall. Denies N/V/D or fever. EMS states no deformity to left hip. IV Fentanyl x2 total of given in route.

## 2013-09-23 NOTE — ED Notes (Signed)
Bed: WA13 Expected date:  Expected time:  Means of arrival:  Comments: EMS-fall-hip pain 

## 2013-12-21 ENCOUNTER — Telehealth: Payer: Self-pay | Admitting: Internal Medicine

## 2013-12-21 DIAGNOSIS — J42 Unspecified chronic bronchitis: Secondary | ICD-10-CM

## 2013-12-21 MED ORDER — BUDESONIDE-FORMOTEROL FUMARATE 160-4.5 MCG/ACT IN AERO: 2.0000 | INHALATION_SPRAY | Freq: Two times a day (BID) | RESPIRATORY_TRACT | Status: DC

## 2013-12-21 NOTE — Telephone Encounter (Signed)
Pt son aware symbicort has been called in. Nothing further needed

## 2014-03-10 ENCOUNTER — Emergency Department (HOSPITAL_COMMUNITY): Payer: Medicare HMO

## 2014-03-10 ENCOUNTER — Encounter (HOSPITAL_COMMUNITY): Payer: Self-pay | Admitting: Emergency Medicine

## 2014-03-10 ENCOUNTER — Emergency Department (HOSPITAL_COMMUNITY)
Admission: EM | Admit: 2014-03-10 | Discharge: 2014-03-10 | Disposition: A | Discharge status: Home / Self Care | Payer: Medicare HMO | Attending: Emergency Medicine | Admitting: Emergency Medicine

## 2014-03-10 DIAGNOSIS — M542 Cervicalgia: Secondary | ICD-10-CM | POA: Diagnosis present

## 2014-03-10 DIAGNOSIS — Z79899 Other long term (current) drug therapy: Secondary | ICD-10-CM | POA: Insufficient documentation

## 2014-03-10 DIAGNOSIS — Z7982 Long term (current) use of aspirin: Secondary | ICD-10-CM | POA: Insufficient documentation

## 2014-03-10 DIAGNOSIS — Z8619 Personal history of other infectious and parasitic diseases: Secondary | ICD-10-CM | POA: Insufficient documentation

## 2014-03-10 DIAGNOSIS — Z87891 Personal history of nicotine dependence: Secondary | ICD-10-CM | POA: Diagnosis not present

## 2014-03-10 DIAGNOSIS — Z8639 Personal history of other endocrine, nutritional and metabolic disease: Secondary | ICD-10-CM | POA: Diagnosis not present

## 2014-03-10 DIAGNOSIS — R51 Headache: Secondary | ICD-10-CM | POA: Insufficient documentation

## 2014-03-10 DIAGNOSIS — Z880 Allergy status to penicillin: Secondary | ICD-10-CM | POA: Diagnosis not present

## 2014-03-10 DIAGNOSIS — Z8709 Personal history of other diseases of the respiratory system: Secondary | ICD-10-CM | POA: Insufficient documentation

## 2014-03-10 DIAGNOSIS — I10 Essential (primary) hypertension: Secondary | ICD-10-CM | POA: Diagnosis not present

## 2014-03-10 MED ORDER — ACETAMINOPHEN 325 MG PO TABS
650.0000 mg | ORAL_TABLET | Freq: Once | ORAL | Status: AC
  Administered 2014-03-10: 650 mg via ORAL
  Filled 2014-03-10: qty 2

## 2014-03-10 NOTE — ED Provider Notes (Signed)
CSN: [id number]     Arrival date & time 03/10/14  1558 History   First MD Initiated Contact with Patient 03/10/14 1729     Chief Complaint  Patient presents with  . Neck Pain  . Headache     (Consider location/radiation/quality/duration/timing/severity/associated sxs/prior Treatment) HPI Rhonda Moore is an 80 year old female past medical history of hypertension, hyperlipidemia, cardiomegaly who presents to the ER complaining of left-sided neck pain and headache. Patient states his pain has been present for "months", however she has never had it evaluated before. Patient states her pain is a "sharp pain" which radiates from her left side of her neck up into her left side of her head. Patient states at rest her pain is mild, however when she turns her head to the left the pain becomes severe. She states she came to the ER tonight because her pain has never been as severe as it is tonight.patient denies new pain, weakness, blurred vision, dizziness, loss of vision, eye pain, confusion, syncope, chest pain, shortness of breath, abdominal pain, dysuria, fever, bowel/bladder incontinence/retention.   Past Medical History  Diagnosis Date  . Hypertension   . Palpitation   . Hyperlipidemia   . Cardiomegaly   . Bronchitis   . Shingles 2007   Past Surgical History  Procedure Laterality Date  . Cervical spine surgery      x 2  . Lumbar spine surgery      x 2  . Shoulder surgery    . Left hip replacement     History reviewed. No pertinent family history. History  Substance Use Topics  . Smoking status: Former Smoker    Types: Cigarettes    Quit date: 05/07/1948  . Smokeless tobacco: Not on file  . Alcohol Use: Not on file   OB History    No data available     Review of Systems  Constitutional: Negative for fever.  HENT: Negative for trouble swallowing.   Eyes: Negative for visual disturbance.  Respiratory: Negative for shortness of breath.   Cardiovascular: Negative for chest pain.   Gastrointestinal: Negative for nausea, vomiting and abdominal pain.  Genitourinary: Negative for dysuria.  Musculoskeletal: Positive for neck pain. Negative for neck stiffness.  Skin: Negative for rash.  Neurological: Positive for headaches. Negative for dizziness, weakness and numbness.  Psychiatric/Behavioral: Negative.       Allergies  Penicillins  Home Medications   Prior to Admission medications   Medication Sig Start Date End Date Taking? Authorizing Provider  amLODipine (NORVASC) 5 MG tablet Take 1 tablet by mouth daily. 05/14/12  Yes Historical Provider, MD  aspirin 81 MG tablet Take 81 mg by mouth daily.     Yes Historical Provider, MD  atenolol (TENORMIN) 50 MG tablet Take 50 mg by mouth 2 (two) times daily.    Yes Historical Provider, MD  budesonide-formoterol (SYMBICORT) 160-4.5 MCG/ACT inhaler Inhale 2 puffs into the lungs 2 (two) times daily. Rinse mouth 12/21/13 12/21/14 Yes Waymon Budge, MD  Cholecalciferol (VITAMIN D-400 PO) Take 1 tablet by mouth 2 (two) times daily.   Yes Historical Provider, MD  citalopram (CELEXA) 40 MG tablet Take 40 mg by mouth daily.     Yes Historical Provider, MD  ipratropium (ATROVENT) 0.06 % nasal spray 1-2 sprays each nostril, up to 4 times daily as needed 10/25/11 03/10/14 Yes Clinton D Young, MD  losartan (COZAAR) 50 MG tablet Take 1 tablet by mouth daily. 05/26/12  Yes Historical Provider, MD  traMADol (ULTRAM) 50 MG tablet  Take 50 mg by mouth 3 (three) times daily as needed.  04/24/12  Yes Historical Provider, MD  HYDROcodone-acetaminophen (NORCO/VICODIN) 5-325 MG per tablet Take 0.5 tablets by mouth every 4 (four) hours as needed for moderate pain. 09/23/13   Gilda Crease, MD   BP 148/72 mmHg  Pulse 74  Temp(Src) 98.3 F (36.8 C) (Oral)  Resp 18  SpO2 98% Physical Exam  Constitutional: She appears well-developed and well-nourished. No distress.  HENT:  Head: Normocephalic and atraumatic.  Mouth/Throat: Oropharynx is clear  and moist. No oropharyngeal exudate.  Eyes: Right eye exhibits no discharge. Left eye exhibits no discharge. No scleral icterus.  Neck: Normal range of motion.  Cardiovascular: Normal rate, regular rhythm and normal heart sounds.   No murmur heard. Pulmonary/Chest: Effort normal and breath sounds normal. No respiratory distress.  Abdominal: Soft. There is no tenderness.  Musculoskeletal: Normal range of motion. She exhibits no edema or tenderness.  Neurological: She is alert. She has normal strength. No cranial nerve deficit or sensory deficit. Coordination normal. GCS eye subscore is 4. GCS verbal subscore is 5. GCS motor subscore is 6.  Patient fully alert answering questions appropriately in full, clear sentences. Cranial nerves II through XII grossly intact. Motor strength 5 out of 5 in all major muscle groups of lower extremities. Patient has 5 out of 5 strength in right arm, 4-5 strength in left arm which she reports is baseline for her.  Skin: Skin is warm and dry. No rash noted. She is not diaphoretic.  Psychiatric: She has a normal mood and affect.  Nursing note and vitals reviewed.   ED Course  Procedures (including critical care time) Labs Review Labs Reviewed - No data to display  Imaging Review Ct Head Wo Contrast  03/10/2014   CLINICAL DATA:  Severe head and neck pain  EXAM: CT HEAD WITHOUT CONTRAST  CT CERVICAL SPINE WITHOUT CONTRAST  TECHNIQUE: Multidetector CT imaging of the head and cervical spine was performed following the standard protocol without intravenous contrast. Multiplanar CT image reconstructions of the cervical spine were also generated.  COMPARISON:  Head CT Sep 14, 2013; brain MRI May 30, 2009  FINDINGS: CT HEAD FINDINGS  There is age related volume loss. There is no appreciable mass, hemorrhage, extra-axial fluid collection, or midline shift. There is patchy small vessel disease throughout the centra semiovale bilaterally. No acute infarct apparent. Bony  calvarium appears intact. The mastoid air cells are clear.  CT CERVICAL SPINE FINDINGS  The patient is status post anterior fusion at C6 and C7. There is advanced arthropathy in the midcervical spine with marked disc space narrowing at C4-5. There is ankylosis at C5-6 and C6-7. There is severe disc space narrowing at C7-T1 and T1-2. There is 5 mm of anterolisthesis of C3 on C4. There is retrolisthesis at C5 on C6, in part due to postoperative change. There is no other spondylolisthesis. There is no acute fracture apparent. The prevertebral soft tissues and predental space regions are within normal limits. There is facet osteoarthritic change at essentially all levels bilaterally. There is exit foraminal narrowing which appears rather marked at C2-3 on the left, C3-4 bilaterally, C4-5 bilaterally, C5-6 on the right, and C7-T1 bilaterally. There is no frank disc extrusion. There is no high-grade spinal stenosis. There is carotid artery calcification bilaterally. There are small calcifications in the thyroid.  IMPRESSION: CT head: Age related volume loss with patchy supratentorial small vessel disease. No intracranial mass, hemorrhage, or acute appearing infarct.  CT  cervical spine: Extensive spondylosis. Postoperative change in the lower cervical spine. Multilevel facet hypertrophy. Spondylolisthesis at C3-4 is felt to be due to underlying spondylosis. No fracture apparent. There is carotid artery calcification bilaterally. There are several small calcifications in the thyroid. This finding may warrant nonemergent thyroid ultrasound to further assess.   Electronically Signed   By: Bretta BangWilliam  Woodruff M.D.   On: 03/10/2014 20:55   Ct Cervical Spine Wo Contrast  03/10/2014   CLINICAL DATA:  Severe head and neck pain  EXAM: CT HEAD WITHOUT CONTRAST  CT CERVICAL SPINE WITHOUT CONTRAST  TECHNIQUE: Multidetector CT imaging of the head and cervical spine was performed following the standard protocol without intravenous  contrast. Multiplanar CT image reconstructions of the cervical spine were also generated.  COMPARISON:  Head CT Sep 14, 2013; brain MRI May 30, 2009  FINDINGS: CT HEAD FINDINGS  There is age related volume loss. There is no appreciable mass, hemorrhage, extra-axial fluid collection, or midline shift. There is patchy small vessel disease throughout the centra semiovale bilaterally. No acute infarct apparent. Bony calvarium appears intact. The mastoid air cells are clear.  CT CERVICAL SPINE FINDINGS  The patient is status post anterior fusion at C6 and C7. There is advanced arthropathy in the midcervical spine with marked disc space narrowing at C4-5. There is ankylosis at C5-6 and C6-7. There is severe disc space narrowing at C7-T1 and T1-2. There is 5 mm of anterolisthesis of C3 on C4. There is retrolisthesis at C5 on C6, in part due to postoperative change. There is no other spondylolisthesis. There is no acute fracture apparent. The prevertebral soft tissues and predental space regions are within normal limits. There is facet osteoarthritic change at essentially all levels bilaterally. There is exit foraminal narrowing which appears rather marked at C2-3 on the left, C3-4 bilaterally, C4-5 bilaterally, C5-6 on the right, and C7-T1 bilaterally. There is no frank disc extrusion. There is no high-grade spinal stenosis. There is carotid artery calcification bilaterally. There are small calcifications in the thyroid.  IMPRESSION: CT head: Age related volume loss with patchy supratentorial small vessel disease. No intracranial mass, hemorrhage, or acute appearing infarct.  CT cervical spine: Extensive spondylosis. Postoperative change in the lower cervical spine. Multilevel facet hypertrophy. Spondylolisthesis at C3-4 is felt to be due to underlying spondylosis. No fracture apparent. There is carotid artery calcification bilaterally. There are several small calcifications in the thyroid. This finding may warrant  nonemergent thyroid ultrasound to further assess.   Electronically Signed   By: Bretta BangWilliam  Woodruff M.D.   On: 03/10/2014 20:55     EKG Interpretation None      MDM   Final diagnoses:  Neck pain    Patient here with neck pain, stating it is been present for months. Patient's pain seems musculoskeletal in nature with it being reproducible with movement, and relieved with positioning., however she has never had an evaluation of it before. Patient stating she has extensive neck problems, and chronic neck pain along with a history of cervical fusion. Will follow up with CT head and neck due to patient's age, history of osteoarthritis to rule out fracture. No radicular signs on exam, neuro exam benign. No concern for spinal stenosis or impingement at this time based on patient's exam.  CT with impression: CT head: Age related volume loss with patchy supratentorial small vessel disease. No intracranial mass, hemorrhage, or acute appearing infarct.  CT cervical spine: Extensive spondylosis. Postoperative change in the lower cervical spine. Multilevel facet  hypertrophy. Spondylolisthesis at C3-4 is felt to be due to underlying spondylosis. No fracture apparent. There is carotid artery calcification bilaterally. There are several small calcifications in the thyroid. This finding may warrant nonemergent thyroid ultrasound to further assess.  Patient discharged, with strict instructions to follow-up with her primary care physicianr egarding this neck pain and her CT results. We discussed return precautions with patient, and strongly encouraged her to call or return to the ER should she have any worsening symptoms or should she have any questions or concerns.  BP 148/72 mmHg  Pulse 74  Temp(Src) 98.3 F (36.8 C) (Oral)  Resp 18  SpO2 98%  Signed,  Ladona MowJoe Mariette Cowley, PA-C 2:17 AM  This patient seen and discussed with Dr. Jerelyn ScottMartha Linker, M.D.  Monte FantasiaJoseph W Rhyatt Muska, PA-C 03/11/14 [phone]  Ethelda ChickMartha K  Linker, MD 03/11/14 [phone]

## 2014-03-10 NOTE — ED Notes (Signed)
Pt states she has had 2 neck surgeries but today her neck and L side of her head have been in severe pain. Alert and oriented.

## 2014-03-10 NOTE — Discharge Instructions (Signed)
Cervical Sprain  A cervical sprain is an injury in the neck in which the strong, fibrous tissues (ligaments) that connect your neck bones stretch or tear. Cervical sprains can range from mild to severe. Severe cervical sprains can cause the neck vertebrae to be unstable. This can lead to damage of the spinal cord and can result in serious nervous system problems. The amount of time it takes for a cervical sprain to get better depends on the cause and extent of the injury. Most cervical sprains heal in 1 to 3 weeks.  CAUSES   Severe cervical sprains may be caused by:    Contact sport injuries (such as from football, rugby, wrestling, hockey, auto racing, gymnastics, diving, martial arts, or boxing).    Motor vehicle collisions.    Whiplash injuries. This is an injury from a sudden forward and backward whipping movement of the head and neck.   Falls.   Mild cervical sprains may be caused by:    Being in an awkward position, such as while cradling a telephone between your ear and shoulder.    Sitting in a chair that does not offer proper support.    Working at a poorly designed computer station.    Looking up or down for long periods of time.   SYMPTOMS    Pain, soreness, stiffness, or a burning sensation in the front, back, or sides of the neck. This discomfort may develop immediately after the injury or slowly, 24 hours or more after the injury.    Pain or tenderness directly in the middle of the back of the neck.    Shoulder or upper back pain.    Limited ability to move the neck.    Headache.    Dizziness.    Weakness, numbness, or tingling in the hands or arms.    Muscle spasms.    Difficulty swallowing or chewing.    Tenderness and swelling of the neck.   DIAGNOSIS   Most of the time your health care provider can diagnose a cervical sprain by taking your history and doing a physical exam. Your health care provider will ask about previous neck injuries and any known neck  problems, such as arthritis in the neck. X-rays may be taken to find out if there are any other problems, such as with the bones of the neck. Other tests, such as a CT scan or MRI, may also be needed.   TREATMENT   Treatment depends on the severity of the cervical sprain. Mild sprains can be treated with rest, keeping the neck in place (immobilization), and pain medicines. Severe cervical sprains are immediately immobilized. Further treatment is done to help with pain, muscle spasms, and other symptoms and may include:   Medicines, such as pain relievers, numbing medicines, or muscle relaxants.    Physical therapy. This may involve stretching exercises, strengthening exercises, and posture training. Exercises and improved posture can help stabilize the neck, strengthen muscles, and help stop symptoms from returning.   HOME CARE INSTRUCTIONS    Put ice on the injured area.    Put ice in a plastic bag.    Place a towel between your skin and the bag.    Leave the ice on for 15-20 minutes, 3-4 times a day.    If your injury was severe, you may have been given a cervical collar to wear. A cervical collar is a two-piece collar designed to keep your neck from moving while it heals.   Do not   remove the collar unless instructed by your health care provider.   If you have long hair, keep it outside of the collar.   Ask your health care provider before making any adjustments to your collar. Minor adjustments may be required over time to improve comfort and reduce pressure on your chin or on the back of your head.   Ifyou are allowed to remove the collar for cleaning or bathing, follow your health care provider's instructions on how to do so safely.   Keep your collar clean by wiping it with mild soap and water and drying it completely. If the collar you have been given includes removable pads, remove them every 1-2 days and hand wash them with soap and water. Allow them to air dry. They should be completely  dry before you wear them in the collar.   If you are allowed to remove the collar for cleaning and bathing, wash and dry the skin of your neck. Check your skin for irritation or sores. If you see any, tell your health care provider.   Do not drive while wearing the collar.    Only take over-the-counter or prescription medicines for pain, discomfort, or fever as directed by your health care provider.    Keep all follow-up appointments as directed by your health care provider.    Keep all physical therapy appointments as directed by your health care provider.    Make any needed adjustments to your workstation to promote good posture.    Avoid positions and activities that make your symptoms worse.    Warm up and stretch before being active to help prevent problems.   SEEK MEDICAL CARE IF:    Your pain is not controlled with medicine.    You are unable to decrease your pain medicine over time as planned.    Your activity level is not improving as expected.   SEEK IMMEDIATE MEDICAL CARE IF:    You develop any bleeding.   You develop stomach upset.   You have signs of an allergic reaction to your medicine.    Your symptoms get worse.    You develop new, unexplained symptoms.    You have numbness, tingling, weakness, or paralysis in any part of your body.   MAKE SURE YOU:    Understand these instructions.   Will watch your condition.   Will get help right away if you are not doing well or get worse.  Document Released: 02/18/2007 Document Revised: 04/28/2013 Document Reviewed: 10/29/2012  [employer] Patient Information 2015 [employer]. This information is not intended to replace advice given to you by your health care provider. Make sure you discuss any questions you have with your health care provider.  Cervical Radiculopathy  Cervical radiculopathy happens when a nerve in the neck is pinched or bruised by a slipped (herniated) disk or by arthritic changes in the bones of the cervical  spine. This can occur due to an injury or as part of the normal aging process. Pressure on the cervical nerves can cause pain or numbness that runs from your neck all the way down into your arm and fingers.  CAUSES   There are many possible causes, including:   Injury.   Muscle tightness in the neck from overuse.   Swollen, painful joints (arthritis).   Breakdown or degeneration in the bones and joints of the spine (spondylosis) due to aging.   Bone spurs that may develop near the cervical nerves.  SYMPTOMS   Symptoms include   pain, weakness, or numbness in the affected arm and hand. Pain can be severe or irritating. Symptoms may be worse when extending or turning the neck.  DIAGNOSIS   Your caregiver will ask about your symptoms and do a physical exam. He or she may test your strength and reflexes. X-rays, CT scans, and MRI scans may be needed in cases of injury or if the symptoms do not go away after a period of time. Electromyography (EMG) or nerve conduction testing may be done to study how your nerves and muscles are working.  TREATMENT   Your caregiver may recommend certain exercises to help relieve your symptoms. Cervical radiculopathy can, and often does, get better with time and treatment. If your problems continue, treatment options may include:   Wearing a soft collar for short periods of time.   Physical therapy to strengthen the neck muscles.   Medicines, such as nonsteroidal anti-inflammatory drugs (NSAIDs), oral corticosteroids, or spinal injections.   Surgery. Different types of surgery may be done depending on the cause of your problems.  HOME CARE INSTRUCTIONS    Put ice on the affected area.   Put ice in a plastic bag.   Place a towel between your skin and the bag.   Leave the ice on for 15-20 minutes, 03-04 times a day or as directed by your caregiver.   If ice does not help, you can try using heat. Take a warm shower or bath, or use a hot water bottle as directed by your  caregiver.   You may try a gentle neck and shoulder massage.   Use a flat pillow when you sleep.   Only take over-the-counter or prescription medicines for pain, discomfort, or fever as directed by your caregiver.   If physical therapy was prescribed, follow your caregiver's directions.   If a soft collar was prescribed, use it as directed.  SEEK IMMEDIATE MEDICAL CARE IF:    Your pain gets much worse and cannot be controlled with medicines.   You have weakness or numbness in your hand, arm, face, or leg.   You have a high fever or a stiff, rigid neck.   You lose bowel or bladder control (incontinence).   You have trouble with walking, balance, or speaking.  MAKE SURE YOU:    Understand these instructions.   Will watch your condition.   Will get help right away if you are not doing well or get worse.  Document Released: 01/16/2001 Document Revised: 07/16/2011 Document Reviewed: 12/05/2010  [employer] Patient Information 2015 [employer]. This information is not intended to replace advice given to you by your health care provider. Make sure you discuss any questions you have with your health care provider.

## 2014-03-23 ENCOUNTER — Other Ambulatory Visit (INDEPENDENT_AMBULATORY_CARE_PROVIDER_SITE_OTHER): Payer: Medicare HMO

## 2014-03-23 ENCOUNTER — Other Ambulatory Visit: Payer: Medicare HMO

## 2014-03-23 ENCOUNTER — Encounter: Payer: Self-pay | Admitting: Internal Medicine

## 2014-03-23 ENCOUNTER — Ambulatory Visit (INDEPENDENT_AMBULATORY_CARE_PROVIDER_SITE_OTHER): Payer: Medicare HMO | Admitting: Internal Medicine

## 2014-03-23 ENCOUNTER — Ambulatory Visit (INDEPENDENT_AMBULATORY_CARE_PROVIDER_SITE_OTHER)
Admission: RE | Admit: 2014-03-23 | Discharge: 2014-03-23 | Disposition: A | Discharge status: Home / Self Care | Payer: Medicare HMO | Source: Ambulatory Visit | Attending: Internal Medicine | Admitting: Internal Medicine

## 2014-03-23 ENCOUNTER — Ambulatory Visit: Payer: Medicare Other | Admitting: Internal Medicine

## 2014-03-23 VITALS — BP 118/74 | HR 82 | Ht 63.0 in | Wt 133.0 lb

## 2014-03-23 DIAGNOSIS — J449 Chronic obstructive pulmonary disease, unspecified: Secondary | ICD-10-CM

## 2014-03-23 DIAGNOSIS — J310 Chronic rhinitis: Secondary | ICD-10-CM

## 2014-03-23 LAB — CBC WITH DIFFERENTIAL/PLATELET
BASOS PCT: 0.5 % (ref 0.0–3.0)
Basophils Absolute: 0 10*3/uL (ref 0.0–0.1)
EOS PCT: 2.5 % (ref 0.0–5.0)
Eosinophils Absolute: 0.1 10*3/uL (ref 0.0–0.7)
HEMATOCRIT: 30.7 % — AB (ref 36.0–46.0)
Hemoglobin: 10.4 g/dL — ABNORMAL LOW (ref 12.0–15.0)
Lymphocytes Relative: 23 % (ref 12.0–46.0)
Lymphs Abs: 1.1 10*3/uL (ref 0.7–4.0)
MCHC: 33.9 g/dL (ref 30.0–36.0)
MCV: 92.2 fl (ref 78.0–100.0)
MONO ABS: 1 10*3/uL (ref 0.1–1.0)
MONOS PCT: 19.8 % — AB (ref 3.0–12.0)
NEUTROS ABS: 2.7 10*3/uL (ref 1.4–7.7)
Neutrophils Relative %: 54.2 % (ref 43.0–77.0)
PLATELETS: 154 10*3/uL (ref 150.0–400.0)
RBC: 3.33 Mil/uL — AB (ref 3.87–5.11)
RDW: 13 % (ref 11.5–15.5)
WBC: 4.9 10*3/uL (ref 4.0–10.5)

## 2014-03-23 NOTE — Patient Instructions (Addendum)
Order- CXR    Dx COPD with chronic bronchitis  Order- lab- CBC w diff  Please call if we can help

## 2014-03-23 NOTE — Progress Notes (Signed)
Patient ID: Rhonda Moore, female    DOB: 01/31/1926, 80 y.o.   MRN: [id number]  HPI 10/24/10 - [age] yoF former smoker followed for COPD Last here July 24, 2010- note reviewed CXR 07/24/10- compared back to July, 2011 shows persistent density c/w scar left infrahilum, improved. I discussed this with her.  Feels more energy. Thinks she may be a little more short of breath in last few weeks with cough productive white/ yellow to clear. It is looser now.   04/26/11- [age] yoF former smoker followed for COPD Breathing varies, but ok. Had flu shot. Chokes in evening, before supper. Symbicort increased to 160- now sufficient. Still coughing yellow.   10/25/11- [age] yoF former smoker followed for COPD Still c/o cough with yellow mucus, mild chest pain, and wheezing at night.  Denies chest tightness Continue Symbicort daily and uses rescue inhaler only occasionally. Her son lives with her. She can't exert enough to keep the house for the way she wants to do anymore. CXR 07/24/10-  IMPRESSION:  Chronic right infrahilar density, likely in the right middle lobe.  Question scarring. Cannot completely exclude mass. If further  evaluation is felt warranted, CT with contrast may be beneficial.  Stable cardiomegaly.  Original Report Authenticated By: Cyndie ChimeKEVIN G. DOVER, M.D.   05/29/12- [age] yoF former smoker followed for COPD FOLLOWS FOR: starting to have cough at night more than usual-trouble getting mucus up. Cough productive of some clear phlegm. She is not concerned. Had a gastroenteritis but no respiratory viral syndrome. Complains of persistent weakness. CXR 10/26/11 IMPRESSION:  Enlargement of cardiac silhouette.  Chronic right middle lobe opacity adjacent to right heart border,  essentially unchanged since 07/24/2010.  While this could be due to infiltrate, scarring or mass, the lack  of interval change favors a benign process.  Either continued radiographic assessment demonstrate long-term  stability or  computed tomography recommended to exclude underlying  mass.  Original Report Authenticated By: Lollie MarrowMARK A. BOLES, M.D.  09/19/12- [age] yoF former smoker followed for COPD FOLLOWS FOR: Increased SOB, wheezing slight cough(mostly at night and in am). Pain in left side and around back-happens with breathing and or moving. Son here- smoker. Past month has had pain in the left lateral chest. Head [facility], which she understands showed "a touch of pneumonia in the left lung". She does have a followup visit scheduled with her primary care provider to recheck this. She has not recognized fever or discolored sputum.  03/23/13- [age] yoF former smoker followed for COPD Follows for-rov.  Pt c/o mild congestion in throat, feels like she can't get up mucus. CT chest elsewhere 09/22/12- suspected mild atypical infection/ ?MAIC, atherosclerosis Feels the best she has in a long time. We discussed a chest CT [facility] during workup for aneurysm. She denies night sweats or fever, adenopathy or blood in sputum. Little cough. No dysphagia.  11/171/5- [age] yoF former smoker followed for COPD/chronic bronchitis FOLLOWS FOR: has noticed increased SOB; fell in May 2015 hip dislocated and broke her arm as well.      Has had flu and Pvax Gradually more dyspnea rest and exertion. Nothing sudden or specific. Cough scant yellow. No chest pain, fever, nodes or blood, swelling or palpitation.  Review of Systems-see HPI Constitutional:   No-   weight loss, night sweats, fevers, chills, fatigue, lassitude. HEENT:   No-  headaches, difficulty swallowing, tooth/dental problems, sore throat,  No-  sneezing, itching, ear ache, nasal congestion, +post nasal drip,  CV:  No-   chest pain, orthopnea, PND, swelling in lower extremities, anasarca, dizziness, palpitations Resp: +  shortness of breath with exertion or at rest.               +productive cough,  No non-productive  cough,  No- coughing up of blood.              No-   change in color of mucus.  No- wheezing.   Skin: No-   rash or lesions. GI:  No-   heartburn, indigestion, abdominal pain, nausea, vomiting,  GU MS:  No-   joint pain or swelling.   Neuro-     nothing unusual Psych:  No- change in mood or affect. No depression or anxiety.  No memory loss.  Objective:   Physical Exam General- Alert, Oriented, Affect-appropriate, Distress- none acute, elderly, thin Skin- rash-none, lesions- none, excoriation- none Lymphadenopathy- none Head- atraumatic            Eyes- Gross vision intact, PERRLA, conjunctivae clear secretions            Ears- Hearing, canals-normal            Nose- Clear, no-Septal dev, mucus, polyps, erosion, perforation             Throat- Mallampati II , mucosa clear , drainage- none, tonsils- atrophic Neck- flexible , trachea midline, no stridor , thyroid nl, carotid no bruit Chest - symmetrical excursion , unlabored           Heart/CV- RRR , no murmur , no gallop  , no rub, nl s1 s2                           - JVD- none , edema- none, stasis changes- none, varices- none           Lung- clear, wheeze-none, cough- none , dullness-none, rub- none           Chest wall-  Abd-  Br/ Gen/ Rectal- Not done, not indicated Extrem- cyanosis- none, clubbing, none, atrophy- none, strength- nl, +cane Neuro- grossly intact to observation

## 2014-03-24 LAB — PATHOLOGIST SMEAR REVIEW

## 2014-03-28 NOTE — Assessment & Plan Note (Signed)
Usually adequate control for now

## 2014-03-28 NOTE — Assessment & Plan Note (Signed)
Controlled but with dyspnea and scant yellow sputum. Plan- CXR, CBC to r/o anemia

## 2015-03-24 ENCOUNTER — Ambulatory Visit (INDEPENDENT_AMBULATORY_CARE_PROVIDER_SITE_OTHER)
Admission: RE | Admit: 2015-03-24 | Discharge: 2015-03-24 | Disposition: A | Discharge status: Home / Self Care | Payer: Medicare HMO | Source: Ambulatory Visit | Attending: Internal Medicine | Admitting: Internal Medicine

## 2015-03-24 ENCOUNTER — Ambulatory Visit (INDEPENDENT_AMBULATORY_CARE_PROVIDER_SITE_OTHER): Payer: Medicare HMO | Admitting: Internal Medicine

## 2015-03-24 ENCOUNTER — Encounter: Payer: Self-pay | Admitting: Internal Medicine

## 2015-03-24 VITALS — BP 122/70 | HR 73 | Ht 63.0 in | Wt 122.8 lb

## 2015-03-24 DIAGNOSIS — J310 Chronic rhinitis: Secondary | ICD-10-CM | POA: Diagnosis not present

## 2015-03-24 DIAGNOSIS — J449 Chronic obstructive pulmonary disease, unspecified: Secondary | ICD-10-CM

## 2015-03-24 NOTE — Progress Notes (Signed)
Patient ID: PAZ FUENTES, female    DOB: 1925-11-14, 81 y.o.   MRN: [id number]  HPI 10/24/10 - 52 yoF former smoker followed for COPD Last here July 24, 2010- note reviewed CXR 07/24/10- compared back to July, 2011 shows persistent density c/w scar left infrahilum, improved. I discussed this with her.  Feels more energy. Thinks she may be a little more short of breath in last few weeks with cough productive white/ yellow to clear. It is looser now.   04/26/11- 64 yoF former smoker followed for COPD Breathing varies, but ok. Had flu shot. Chokes in evening, before supper. Symbicort increased to 160- now sufficient. Still coughing yellow.   10/25/11- 32 yoF former smoker followed for COPD Still c/o cough with yellow mucus, mild chest pain, and wheezing at night.  Denies chest tightness Continue Symbicort daily and uses rescue inhaler only occasionally. Her son lives with her. She can't exert enough to keep the house for the way she wants to do anymore. CXR 07/24/10-  IMPRESSION:  Chronic right infrahilar density, likely in the right middle lobe.  Question scarring. Cannot completely exclude mass. If further  evaluation is felt warranted, CT with contrast may be beneficial.  Stable cardiomegaly.  Original Report Authenticated By: Cyndie Chime, M.D.   05/29/12- 44 yoF former smoker followed for COPD FOLLOWS FOR: starting to have cough at night more than usual-trouble getting mucus up. Cough productive of some clear phlegm. She is not concerned. Had a gastroenteritis but no respiratory viral syndrome. Complains of persistent weakness. CXR 10/26/11 IMPRESSION:  Enlargement of cardiac silhouette.  Chronic right middle lobe opacity adjacent to right heart border,  essentially unchanged since 07/24/2010.  While this could be due to infiltrate, scarring or mass, the lack  of interval change favors a benign process.  Either continued radiographic assessment demonstrate long-term  stability or  computed tomography recommended to exclude underlying  mass.  Original Report Authenticated By: Lollie Marrow, M.D.  09/19/12- 70 yoF former smoker followed for COPD FOLLOWS FOR: Increased SOB, wheezing slight cough(mostly at night and in am). Pain in left side and around back-happens with breathing and or moving. Son here- smoker. Past month has had pain in the left lateral chest. Head CT scan of the chest at [facility] yesterday, which she understands showed "a touch of pneumonia in the left lung". She does have a followup visit scheduled with her primary care provider to recheck this. She has not recognized fever or discolored sputum.  03/23/13- 77 yoF former smoker followed for COPD Follows for-rov.  Pt c/o mild congestion in throat, feels like she can't get up mucus. CT chest elsewhere 09/22/12- suspected mild atypical infection/ ?MAIC, atherosclerosis Feels the best she has in a long time. We discussed a chest CT she had done at [facility] in May during workup for aneurysm. She denies night sweats or fever, adenopathy or blood in sputum. Little cough. No dysphagia.  11/171/5- 36 yoF former smoker followed for COPD/chronic bronchitis FOLLOWS FOR: has noticed increased SOB; fell in May 2015 hip dislocated and broke her arm as well.      Has had flu and Pvax Gradually more dyspnea rest and exertion. Nothing sudden or specific. Cough scant yellow. No chest pain, fever, nodes or blood, swelling or palpitation.  03/24/2015-81 year old female former smoker followed for COPD/chronic bronchitis, complicated by HBP FOLLOWS FOR: Pt states the past few days she has had increased SOB, slight wheezing and cough. Occasional dyspnea on exertion comes  and goes. Denies cough, chest pain or phlegm. Nose runs. She still cooks and does her own housework at age 81, living alone  Review of Systems-see HPI Constitutional:   No-   weight loss, night sweats, fevers, chills, fatigue, lassitude. HEENT:   No-   headaches, difficulty swallowing, tooth/dental problems, sore throat,       No-  sneezing, itching, ear ache, nasal congestion, +post nasal drip,  CV:  No-   chest pain, orthopnea, PND, swelling in lower extremities, anasarca, dizziness, palpitations Resp: +  shortness of breath with exertion or at rest.               productive cough,  No non-productive cough,  No- coughing up of blood.              No-   change in color of mucus.  No- wheezing.   Skin: No-   rash or lesions. GI:  No-   heartburn, indigestion, abdominal pain, nausea, vomiting,  GU MS:  No-   joint pain or swelling.   Neuro-     nothing unusual Psych:  No- change in mood or affect. No depression or anxiety.  No memory loss.  Objective:   Physical Exam General- Alert, Oriented, Affect-appropriate, Distress- none acute, elderly, thin, O2 sat 97% RA Skin- rash-none, lesions- none, excoriation- none Lymphadenopathy- none Head- atraumatic            Eyes- Gross vision intact, PERRLA, conjunctivae clear secretions            Ears- Hearing, canals-normal            Nose- + blowing nose, no-Septal dev, mucus, polyps, erosion, perforation             Throat- Mallampati II , mucosa clear , drainage- none, tonsils- atrophic Neck- flexible , trachea midline, no stridor , thyroid nl, carotid no bruit Chest - symmetrical excursion , unlabored           Heart/CV- RRR , no murmur , no gallop  , no rub, nl s1 s2                           - JVD- none , edema- none, stasis changes- none, varices- none           Lung- clear, wheeze-none, cough- none , dullness-none, rub- none           Chest wall-  Abd-  Br/ Gen/ Rectal- Not done, not indicated Extrem- cyanosis- none, clubbing, none, atrophy- none, strength- nl, +cane Neuro- grossly intact to observation

## 2015-03-24 NOTE — Patient Instructions (Signed)
Order- CXR     Dx COPD mixed type   Suggest otc antihistamine for allergic/ runny nose, like Claritin/ loratadine

## 2015-03-27 NOTE — Assessment & Plan Note (Signed)
Nonallergic rhinitis, probably vasomotor. Plan-Claritin trial

## 2015-03-27 NOTE — Assessment & Plan Note (Addendum)
Dyspnea on exertion seems to vary over time but with no clear pattern except related to how much she is trying to do. Plan-chest x-ray update

## 2015-03-28 NOTE — Progress Notes (Signed)
Quick Note:  Called and spoke to pt. Informed her of the results and recs per CY. Pt verbalized understanding and denied any further questions or concerns at this time.   ______ 

## 2015-12-06 ENCOUNTER — Emergency Department (HOSPITAL_COMMUNITY): Payer: Medicare HMO

## 2015-12-06 ENCOUNTER — Encounter (HOSPITAL_COMMUNITY): Payer: Self-pay | Admitting: Emergency Medicine

## 2015-12-06 ENCOUNTER — Emergency Department (HOSPITAL_COMMUNITY)
Admission: EM | Admit: 2015-12-06 | Discharge: 2015-12-06 | Disposition: A | Discharge status: Home / Self Care | Payer: Medicare HMO | Attending: Emergency Medicine | Admitting: Emergency Medicine

## 2015-12-06 DIAGNOSIS — J449 Chronic obstructive pulmonary disease, unspecified: Secondary | ICD-10-CM | POA: Diagnosis not present

## 2015-12-06 DIAGNOSIS — I4891 Unspecified atrial fibrillation: Secondary | ICD-10-CM | POA: Diagnosis not present

## 2015-12-06 DIAGNOSIS — E860 Dehydration: Secondary | ICD-10-CM | POA: Diagnosis not present

## 2015-12-06 DIAGNOSIS — R1030 Lower abdominal pain, unspecified: Secondary | ICD-10-CM | POA: Insufficient documentation

## 2015-12-06 DIAGNOSIS — Z96642 Presence of left artificial hip joint: Secondary | ICD-10-CM | POA: Insufficient documentation

## 2015-12-06 DIAGNOSIS — Z87891 Personal history of nicotine dependence: Secondary | ICD-10-CM | POA: Insufficient documentation

## 2015-12-06 DIAGNOSIS — I951 Orthostatic hypotension: Secondary | ICD-10-CM

## 2015-12-06 DIAGNOSIS — Z7982 Long term (current) use of aspirin: Secondary | ICD-10-CM | POA: Diagnosis not present

## 2015-12-06 DIAGNOSIS — R42 Dizziness and giddiness: Secondary | ICD-10-CM | POA: Insufficient documentation

## 2015-12-06 DIAGNOSIS — E871 Hypo-osmolality and hyponatremia: Secondary | ICD-10-CM | POA: Insufficient documentation

## 2015-12-06 DIAGNOSIS — I10 Essential (primary) hypertension: Secondary | ICD-10-CM | POA: Diagnosis not present

## 2015-12-06 DIAGNOSIS — R531 Weakness: Secondary | ICD-10-CM | POA: Diagnosis present

## 2015-12-06 HISTORY — DX: Anemia, unspecified: D64.9

## 2015-12-06 HISTORY — DX: Thrombocytopenia, unspecified: D69.6

## 2015-12-06 HISTORY — DX: Other specified symptoms and signs involving the circulatory and respiratory systems: R09.89

## 2015-12-06 HISTORY — DX: Vitamin D deficiency, unspecified: E55.9

## 2015-12-06 LAB — CBG MONITORING, ED: Glucose-Capillary: 129 mg/dL — ABNORMAL HIGH (ref 65–99)

## 2015-12-06 LAB — CBC
HEMATOCRIT: 31.7 % — AB (ref 36.0–46.0)
Hemoglobin: 10.5 g/dL — ABNORMAL LOW (ref 12.0–15.0)
MCH: 31 pg (ref 26.0–34.0)
MCHC: 33.1 g/dL (ref 30.0–36.0)
MCV: 93.5 fL (ref 78.0–100.0)
Platelets: 103 10*3/uL — ABNORMAL LOW (ref 150–400)
RBC: 3.39 MIL/uL — AB (ref 3.87–5.11)
RDW: 13.9 % (ref 11.5–15.5)
WBC: 4.1 10*3/uL (ref 4.0–10.5)

## 2015-12-06 LAB — URINALYSIS, ROUTINE W REFLEX MICROSCOPIC
Bilirubin Urine: NEGATIVE
GLUCOSE, UA: NEGATIVE mg/dL
Hgb urine dipstick: NEGATIVE
Ketones, ur: NEGATIVE mg/dL
LEUKOCYTES UA: NEGATIVE
NITRITE: NEGATIVE
PH: 6.5 (ref 5.0–8.0)
PROTEIN: NEGATIVE mg/dL
Specific Gravity, Urine: 1.007 (ref 1.005–1.030)

## 2015-12-06 LAB — BASIC METABOLIC PANEL
ANION GAP: 9 (ref 5–15)
BUN: 14 mg/dL (ref 6–20)
CHLORIDE: 95 mmol/L — AB (ref 101–111)
CO2: 26 mmol/L (ref 22–32)
Calcium: 9.5 mg/dL (ref 8.9–10.3)
Creatinine, Ser: 1.13 mg/dL — ABNORMAL HIGH (ref 0.44–1.00)
GFR calc non Af Amer: 42 mL/min — ABNORMAL LOW (ref >60)
GFR, EST AFRICAN AMERICAN: 49 mL/min — AB (ref >60)
Glucose, Bld: 144 mg/dL — ABNORMAL HIGH (ref 65–99)
POTASSIUM: 4.2 mmol/L (ref 3.5–5.1)
SODIUM: 130 mmol/L — AB (ref 135–145)

## 2015-12-06 LAB — I-STAT TROPONIN, ED: TROPONIN I, POC: 0.01 ng/mL (ref 0.00–0.08)

## 2015-12-06 LAB — I-STAT CG4 LACTIC ACID, ED: LACTIC ACID, VENOUS: 1.81 mmol/L (ref 0.5–1.9)

## 2015-12-06 LAB — MAGNESIUM: MAGNESIUM: 1.6 mg/dL — AB (ref 1.7–2.4)

## 2015-12-06 MED ORDER — SODIUM CHLORIDE 0.9 % IV BOLUS (SEPSIS)
1000.0000 mL | Freq: Once | INTRAVENOUS | Status: AC
  Administered 2015-12-06: 1000 mL via INTRAVENOUS

## 2015-12-06 MED ORDER — SODIUM CHLORIDE 0.9 % IV BOLUS (SEPSIS)
500.0000 mL | Freq: Once | INTRAVENOUS | Status: AC
  Administered 2015-12-06: 500 mL via INTRAVENOUS

## 2015-12-06 MED ORDER — MAGNESIUM SULFATE 2 GM/50ML IV SOLN
2.0000 g | Freq: Once | INTRAVENOUS | Status: AC
  Administered 2015-12-06: 2 g via INTRAVENOUS
  Filled 2015-12-06: qty 50

## 2015-12-06 MED ORDER — IOPAMIDOL (ISOVUE-300) INJECTION 61%
INTRAVENOUS | Status: AC
  Administered 2015-12-06: 75 mL
  Filled 2015-12-06: qty 75

## 2015-12-06 MED ORDER — MAGNESIUM OXIDE 400 MG PO CAPS: 400.0000 mg | ORAL_CAPSULE | Freq: Every day | ORAL | 0 refills | Status: AC

## 2015-12-06 NOTE — ED Notes (Signed)
Pt's CBG result: 129, informed Elizabeth-RN.

## 2015-12-06 NOTE — ED Triage Notes (Signed)
Per Pt, Pt is coming from home with family. Complains of staggering gait, sweating, "feeling funny in the head," and dizziness. Pt reports having the dizziness for multiple years, but she reports it getting worse today. Pt is alert and oriented x4. Reports being able to "keep house." Pt reports fall one week ago where she fell back and did not feel like she injured herself. Had one episode of vomiting on Saturday.

## 2015-12-06 NOTE — ED Notes (Signed)
I-stat chem 8 resulted but not crossing over. Phlebotomy sts will re-dock machines in attempt to have results cross over.

## 2015-12-06 NOTE — ED Notes (Signed)
Pt to xray

## 2015-12-06 NOTE — ED Provider Notes (Addendum)
[facility]-EMERGENCY DEPT Provider Note   CSN: [id number] Arrival date & time: 12/06/15  1138  First Provider Contact:  First MD Initiated Contact with Patient 12/06/15 1209        History   Chief Complaint Chief Complaint  Patient presents with  . Weakness    HPI Rhonda Moore is a 82 y.o. female.  HPI 82 year old female with past medical history of hypertension, hyperlipidemia, chronic thrombocytopenia, who presents with generalized weakness. The patient states that she has felt generally unwell for the last several years. She states that she has chronic dizziness upon standing that resolves with sitting back down. This is an ongoing issue, but has mildly worsened. She also endorses general fatigue. Denies any unilateral numbness or weakness. Denies any other recent changes in her health. No recent medication changes. Denies any cough, shortness of breath or sputum production. Denies any abdominal pain but has had some mild suprapubic fullness. Denies any diarrhea. No chest pain.  Past Medical History:  Diagnosis Date  . Anemia   . Bronchitis   . Cardiomegaly   . Hyperlipidemia   . Hypertension   . Left carotid bruit   . Palpitation   . Shingles 2007  . Thrombocytopenia (HCC)   . Vitamin D deficiency     Patient Active Problem List   Diagnosis Date Noted  . Non-allergic rhinitis 11/02/2011  . HYPERLIPIDEMIA 11/07/2009  . HYPERTENSION 11/07/2009  . TOBACCO USE, QUIT 11/07/2009  . COPD with chronic bronchitis (HCC) 11/04/2009    Past Surgical History:  Procedure Laterality Date  . ABDOMINAL HYSTERECTOMY    . CATARACT EXTRACTION    . CERVICAL SPINE SURGERY     x 2  . CHOLECYSTECTOMY    . DENTAL SURGERY    . EXTERNAL EAR SURGERY    . JOINT REPLACEMENT    . left hip replacement    . LUMBAR SPINE SURGERY     x 2  . SHOULDER SURGERY      OB History    No data available       Home Medications    Prior to Admission medications   Medication Sig Start Date End  Date Taking? Authorizing Provider  amLODipine (NORVASC) 5 MG tablet Take 1 tablet by mouth daily. 05/14/12  Yes Historical Provider, MD  aspirin 81 MG tablet Take 81 mg by mouth daily.     Yes Historical Provider, MD  atenolol (TENORMIN) 50 MG tablet Take 50 mg by mouth 2 (two) times daily.    Yes Historical Provider, MD  Cholecalciferol (VITAMIN D-400 PO) Take 1 tablet by mouth 2 (two) times daily.   Yes Historical Provider, MD  citalopram (CELEXA) 20 MG tablet Take 1 tablet by mouth daily. 03/11/14  Yes Historical Provider, MD  levothyroxine (SYNTHROID, LEVOTHROID) 50 MCG tablet Take 1 tablet by mouth daily. 03/22/14  Yes Historical Provider, MD  losartan (COZAAR) 50 MG tablet Take 1 tablet by mouth daily. 05/26/12  Yes Historical Provider, MD  traMADol (ULTRAM) 50 MG tablet Take 50 mg by mouth 3 (three) times daily as needed for moderate pain.  04/24/12  Yes Historical Provider, MD  budesonide-formoterol (SYMBICORT) 160-4.5 MCG/ACT inhaler Inhale 2 puffs into the lungs 2 (two) times daily. Rinse mouth 12/21/13 03/24/15  Waymon Budge, MD  ipratropium (ATROVENT) 0.06 % nasal spray 1-2 sprays each nostril, up to 4 times daily as needed 10/25/11 03/24/15  Waymon Budge, MD  Magnesium Oxide 400 MG CAPS Take 1 capsule (400 mg total) by  mouth daily. 12/06/15 12/11/15  Shaune Pollack, MD    Family History No family history on file.  Social History Social History  Substance Use Topics  . Smoking status: Former Smoker    Types: Cigarettes    Quit date: 05/07/1948  . Smokeless tobacco: Current User    Types: Snuff  . Alcohol use No     Allergies   Hydrocodone and Penicillins   Review of Systems Review of Systems  Constitutional: Positive for fatigue. Negative for chills and fever.  HENT: Negative for congestion and rhinorrhea.   Eyes: Negative for visual disturbance.  Respiratory: Negative for cough, shortness of breath and wheezing.   Cardiovascular: Negative for chest pain and leg swelling.   Gastrointestinal: Negative for abdominal pain, diarrhea, nausea and vomiting.  Genitourinary: Positive for pelvic pain. Negative for dysuria, flank pain and frequency.  Musculoskeletal: Negative for neck pain and neck stiffness.  Skin: Negative for rash and wound.  Allergic/Immunologic: Negative for immunocompromised state.  Neurological: Negative for syncope, weakness and headaches.     Physical Exam Updated Vital Signs BP 107/81   Pulse 68   Temp 97.7 F (36.5 C) (Oral)   Resp 19   Ht 5\' 4"  (1.626 m)   Wt 122 lb (55.3 kg)   SpO2 100%   BMI 20.94 kg/m   Physical Exam  Constitutional: She is oriented to person, place, and time. She appears well-developed and well-nourished. No distress.  HENT:  Head: Normocephalic and atraumatic.  Mouth/Throat: Oropharynx is clear and moist.  Eyes: Conjunctivae are normal. Pupils are equal, round, and reactive to light.  Neck: Neck supple.  Cardiovascular: Normal rate, regular rhythm and normal heart sounds.  Exam reveals no friction rub.   No murmur heard. Pulmonary/Chest: Effort normal and breath sounds normal. No respiratory distress. She has no wheezes. She has no rales.  Abdominal: She exhibits no distension. There is tenderness in the suprapubic area. There is no rigidity, no rebound, no guarding, no CVA tenderness and no tenderness at McBurney's point.  Musculoskeletal: She exhibits no edema.  Neurological: She is alert and oriented to person, place, and time. She has normal strength. No cranial nerve deficit or sensory deficit. She exhibits normal muscle tone. Gait normal. GCS eye subscore is 4. GCS verbal subscore is 5. GCS motor subscore is 6.  Skin: Skin is warm. Capillary refill takes less than 2 seconds.  Nursing note and vitals reviewed.    ED Treatments / Results  Labs (all labs ordered are listed, but only abnormal results are displayed) Labs Reviewed  BASIC METABOLIC PANEL - Abnormal; Notable for the following:        Result Value   Sodium 130 (*)    Chloride 95 (*)    Glucose, Bld 144 (*)    Creatinine, Ser 1.13 (*)    GFR calc non Af Amer 42 (*)    GFR calc Af Amer 49 (*)    All other components within normal limits  CBC - Abnormal; Notable for the following:    RBC 3.39 (*)    Hemoglobin 10.5 (*)    HCT 31.7 (*)    Platelets 103 (*)    All other components within normal limits  MAGNESIUM - Abnormal; Notable for the following:    Magnesium 1.6 (*)    All other components within normal limits  CBG MONITORING, ED - Abnormal; Notable for the following:    Glucose-Capillary 129 (*)    All other components within normal limits  URINALYSIS, ROUTINE W REFLEX MICROSCOPIC (NOT AT [facility])  I-STAT TROPOININ, ED  I-STAT CG4 LACTIC ACID, ED  I-STAT CHEM 8, ED    EKG Atrial fibrillation, rate controlled with VR 79. QRS 94.  Normal intervals. No acute ST segment changes.  Radiology Dg Chest 2 View  Result Date: 12/06/2015 CLINICAL DATA:  Dizziness. EXAM: CHEST  2 VIEW COMPARISON:  Radiographs of March 24, 2015. FINDINGS: Stable cardiomegaly. No pneumothorax or pleural effusion is noted. No acute pulmonary disease is noted. Bony thorax is unremarkable. IMPRESSION: No active cardiopulmonary disease. Electronically Signed   By: Lupita Raider, M.D.   On: 12/06/2015 13:16   Ct Head Wo Contrast  Result Date: 12/06/2015 CLINICAL DATA:  Abnormal gait.  Dizziness. EXAM: CT HEAD WITHOUT CONTRAST TECHNIQUE: Contiguous axial images were obtained from the base of the skull through the vertex without intravenous contrast. COMPARISON:  03/10/2014 FINDINGS: Chronic ischemic changes are present in the periventricular white matter. Mild global atrophy. No mass effect, midline shift, or acute hemorrhage. Mastoid air cells are clear. Cranium is intact. IMPRESSION: No acute intracranial pathology. Electronically Signed   By: Jolaine Click M.D.   On: 12/06/2015 15:48   Ct Abdomen Pelvis W Contrast  Result Date:  12/06/2015 CLINICAL DATA:  Lower abdominal pain. One episode of vomiting 3 days ago. Fall 1 week ago. EXAM: CT ABDOMEN AND PELVIS WITH CONTRAST TECHNIQUE: Multidetector CT imaging of the abdomen and pelvis was performed using the standard protocol following bolus administration of intravenous contrast. CONTRAST:  75mL ISOVUE-300 IOPAMIDOL (ISOVUE-300) INJECTION 61% COMPARISON:  Abdominal ultrasound report 02/28/2013 (images unable to be retrieved at the time of dictation). Chest CTA 05/13/2014. FINDINGS: There is mild subpleural reticulation in the lung bases which may reflect atelectasis and/or mild fibrotic change. There is no pleural effusion. There is a small pericardial effusion which is unchanged from the prior chest CTA. The gallbladder is surgically absent. The common bile duct measures up to 1.8 cm in diameter, equal to that reported on the prior ultrasound. There is also mild central intrahepatic biliary dilatation. No focal liver lesion is identified. The spleen and adrenal glands are unremarkable. Subcentimeter bilateral renal hypodensities are too small to characterize. There is no hydronephrosis. The pancreas is diffusely atrophic. There is a simple appearing cyst or fluid collection in the right upper quadrant just inferior to the duodenum bulb measuring 4.1 x 3.7 cm. There is no bowel dilatation to indicate obstruction. Evaluation of the pelvis is limited by streak artifact from the patient's prior left total hip arthroplasty. Small volume free fluid is present in the pelvis. The bladder is grossly unremarkable. The uterus is absent. The ovaries are symmetric in appearance with calcifications bilaterally. No enlarged lymph nodes are identified. Diffuse atherosclerotic calcification is noted of the abdominal aorta and its major branch vessels. A right common iliac artery aneurysm measures 2.0 cm in diameter with prominent plaque/ thrombus along the wall posteriorly. There is advanced lower lumbar facet  arthrosis with grade 1 anterolisthesis of L3 on L4, L4 on L5, and L5 on S1. IMPRESSION: 1. No acute abnormality identified in the abdomen or pelvis. 2. Chronic biliary dilatation following cholecystectomy. 3. Small volume free fluid in the pelvis. 4. 4 cm simple cyst in the right upper quadrant, of unclear etiology but benign in appearance. Potential considerations include a pancreatic pseudocyst if the patient has had pancreatitis in the past, a pseudocyst or seroma related to prior surgery, or a developmental mesenteric cyst. 5. Unchanged small pericardial effusion.  6. Advanced aortic atherosclerosis. 2 cm right common iliac artery aneurysm. Electronically Signed   By: Sebastian Ache M.D.   On: 12/06/2015 16:13    Procedures Procedures (including critical care time)  Medications Ordered in ED Medications  sodium chloride 0.9 % bolus 1,000 mL (0 mLs Intravenous Stopped 12/06/15 1405)  magnesium sulfate IVPB 2 g 50 mL (0 g Intravenous Stopped 12/06/15 1433)  sodium chloride 0.9 % bolus 500 mL (0 mLs Intravenous Stopped 12/06/15 1545)  iopamidol (ISOVUE-300) 61 % injection (75 mLs  Contrast Given 12/06/15 1517)     Initial Impression / Assessment and Plan / ED Course  I have reviewed the triage vital signs and the nursing notes.  Pertinent labs & imaging results that were available during my care of the patient were reviewed by me and considered in my medical decision making (see chart for details).  Clinical Course  82 year old pleasant female with past medical history as above who presents with generalized weakness. This is been on ongoing issue for over one year. She endorses generalized lightheadedness upon standing that is progressively worsening. No acute changes. On arrival, vital signs are stable. She is markedly orthostatic. She appears clinically dehydrated. My primary suspicion is ongoing clinical dehydration with a component of orthostasis. However, given her age, will obtain broad screening  labs as well as CT abdomen given her abdominal tenderness on my exam. Of note, however, this is also chronic issue.  Labs and imaging reviewed as above. BMP shows mild hyponatremia, which appears new. She has had no seizures, tremors, or signs of symptomatic hyponatremia and she is alert and oriented at this time. Magnesium was also low. I suspect this is secondary to dehydration and poor by mouth intake. Will give normal saline here and replete magnesium. Otherwise, screening lab work is unremarkable. Lactic acid is normal. Screening troponin is negative. Chest x-ray shows no acute on amount. CT head is negative. CT abdomen and pelvis also shows no abnormality.  On reassessment, orthostatics are markedly improved after IV fluids. The patient states she feels much better. She is ambulatory without difficulty. I suspect her symptoms are secondary to dehydration and orthostasis. Regarding her hyponatremia, it is stable to improved on repeat BMP (131). Discussed labs, findings with patient and family. I believe patient is safe and stable for discharge with close PCP follow-up for repeat lab work. Her orthostatics are resolved. Return precautions were given in detail.  Of note, on review of EKG and telemetry. Pt had transient AFib on arrival. After IVF, pt is in NSR. She is well-appearing. She has a self-reported h/o "arrhythmia" and is already on ASA and rate control. Given h/o GIB, anemia, and falls 2/2 orthostasis, I am hesitant to start pt on anticoagulation given only transient AFib with now NSR. It is unclear whether this is a known diagnosis or not. Will send message to PCP and I contact PCP's on-call RN. Letter faxed to PCP. Will advise continued ASA and to call PCP tomorrow. Pt in agreement. Feel risks of anticoagulation outweigh benefits at this time, and will need to be discussed with PCP in detail.  Final Clinical Impressions(s) / ED Diagnoses   Final diagnoses:  Dehydration  Orthostatic  hypotension  Hyponatremia  Transient atrial fibrillation (HCC)  Hypomagnesemia        Shaune Pollack, MD 12/06/15 2144

## 2015-12-06 NOTE — ED Notes (Signed)
Returned to room from CT and hooked up to monitor.

## 2015-12-06 NOTE — ED Notes (Signed)
Patient ambulated in hallways with cane with steady gait. Patient noted weakness in legs after walking approximately 60 feet but was able to walk back to room without assistance.MD notified.

## 2016-03-26 ENCOUNTER — Ambulatory Visit: Payer: Medicare HMO | Admitting: Internal Medicine

## 2016-04-18 ENCOUNTER — Emergency Department (HOSPITAL_COMMUNITY): Payer: Medicare HMO

## 2016-04-18 ENCOUNTER — Encounter (HOSPITAL_COMMUNITY): Payer: Self-pay | Admitting: Emergency Medicine

## 2016-04-18 ENCOUNTER — Emergency Department (HOSPITAL_COMMUNITY)
Admission: EM | Admit: 2016-04-18 | Discharge: 2016-04-18 | Disposition: A | Discharge status: Home / Self Care | Payer: Medicare HMO | Attending: Emergency Medicine | Admitting: Emergency Medicine

## 2016-04-18 DIAGNOSIS — Z87891 Personal history of nicotine dependence: Secondary | ICD-10-CM | POA: Insufficient documentation

## 2016-04-18 DIAGNOSIS — R109 Unspecified abdominal pain: Secondary | ICD-10-CM | POA: Diagnosis not present

## 2016-04-18 DIAGNOSIS — Z79899 Other long term (current) drug therapy: Secondary | ICD-10-CM | POA: Insufficient documentation

## 2016-04-18 DIAGNOSIS — E876 Hypokalemia: Secondary | ICD-10-CM | POA: Diagnosis not present

## 2016-04-18 DIAGNOSIS — Z96642 Presence of left artificial hip joint: Secondary | ICD-10-CM | POA: Diagnosis not present

## 2016-04-18 DIAGNOSIS — R05 Cough: Secondary | ICD-10-CM | POA: Diagnosis present

## 2016-04-18 DIAGNOSIS — R791 Abnormal coagulation profile: Secondary | ICD-10-CM | POA: Insufficient documentation

## 2016-04-18 DIAGNOSIS — J181 Lobar pneumonia, unspecified organism: Secondary | ICD-10-CM | POA: Insufficient documentation

## 2016-04-18 DIAGNOSIS — J449 Chronic obstructive pulmonary disease, unspecified: Secondary | ICD-10-CM | POA: Insufficient documentation

## 2016-04-18 DIAGNOSIS — E860 Dehydration: Secondary | ICD-10-CM | POA: Diagnosis not present

## 2016-04-18 DIAGNOSIS — I10 Essential (primary) hypertension: Secondary | ICD-10-CM | POA: Diagnosis not present

## 2016-04-18 DIAGNOSIS — Z7982 Long term (current) use of aspirin: Secondary | ICD-10-CM | POA: Insufficient documentation

## 2016-04-18 DIAGNOSIS — J189 Pneumonia, unspecified organism: Secondary | ICD-10-CM

## 2016-04-18 LAB — URINALYSIS, ROUTINE W REFLEX MICROSCOPIC
Bilirubin Urine: NEGATIVE
GLUCOSE, UA: NEGATIVE mg/dL
HGB URINE DIPSTICK: NEGATIVE
Ketones, ur: 5 mg/dL — AB
LEUKOCYTES UA: NEGATIVE
Nitrite: NEGATIVE
PH: 7 (ref 5.0–8.0)
PROTEIN: NEGATIVE mg/dL
Specific Gravity, Urine: 1.009 (ref 1.005–1.030)

## 2016-04-18 LAB — CBC WITH DIFFERENTIAL/PLATELET
BASOS PCT: 0 %
Basophils Absolute: 0 10*3/uL (ref 0.0–0.1)
EOS ABS: 0 10*3/uL (ref 0.0–0.7)
Eosinophils Relative: 0 %
HEMATOCRIT: 33 % — AB (ref 36.0–46.0)
HEMOGLOBIN: 11.2 g/dL — AB (ref 12.0–15.0)
LYMPHS ABS: 0.8 10*3/uL (ref 0.7–4.0)
Lymphocytes Relative: 33 %
MCH: 32.2 pg (ref 26.0–34.0)
MCHC: 33.9 g/dL (ref 30.0–36.0)
MCV: 94.8 fL (ref 78.0–100.0)
MONO ABS: 0.4 10*3/uL (ref 0.1–1.0)
MONOS PCT: 15 %
Neutro Abs: 1.3 10*3/uL — ABNORMAL LOW (ref 1.7–7.7)
Neutrophils Relative %: 52 %
Platelets: 104 10*3/uL — ABNORMAL LOW (ref 150–400)
RBC: 3.48 MIL/uL — ABNORMAL LOW (ref 3.87–5.11)
RDW: 13.9 % (ref 11.5–15.5)
WBC: 2.4 10*3/uL — ABNORMAL LOW (ref 4.0–10.5)

## 2016-04-18 LAB — COMPREHENSIVE METABOLIC PANEL
ALBUMIN: 4.4 g/dL (ref 3.5–5.0)
ALK PHOS: 44 U/L (ref 38–126)
ALT: 24 U/L (ref 14–54)
AST: 51 U/L — AB (ref 15–41)
Anion gap: 11 (ref 5–15)
BILIRUBIN TOTAL: 1.1 mg/dL (ref 0.3–1.2)
BUN: 13 mg/dL (ref 6–20)
CALCIUM: 9 mg/dL (ref 8.9–10.3)
CO2: 26 mmol/L (ref 22–32)
CREATININE: 0.88 mg/dL (ref 0.44–1.00)
Chloride: 98 mmol/L — ABNORMAL LOW (ref 101–111)
GFR calc Af Amer: >60 mL/min (ref >60)
GFR calc non Af Amer: 57 mL/min — ABNORMAL LOW (ref >60)
GLUCOSE: 118 mg/dL — AB (ref 65–99)
Potassium: 3.3 mmol/L — ABNORMAL LOW (ref 3.5–5.1)
SODIUM: 135 mmol/L (ref 135–145)
TOTAL PROTEIN: 7.6 g/dL (ref 6.5–8.1)

## 2016-04-18 LAB — INFLUENZA PANEL BY PCR (TYPE A & B)
INFLBPCR: NEGATIVE
Influenza A By PCR: NEGATIVE

## 2016-04-18 LAB — I-STAT TROPONIN, ED: Troponin i, poc: 0.01 ng/mL (ref 0.00–0.08)

## 2016-04-18 LAB — MAGNESIUM: Magnesium: 1.6 mg/dL — ABNORMAL LOW (ref 1.7–2.4)

## 2016-04-18 LAB — PROTIME-INR
INR: 1.19
PROTHROMBIN TIME: 15.2 s (ref 11.4–15.2)

## 2016-04-18 LAB — RAPID STREP SCREEN (MED CTR MEBANE ONLY): Streptococcus, Group A Screen (Direct): NEGATIVE

## 2016-04-18 LAB — I-STAT CG4 LACTIC ACID, ED: Lactic Acid, Venous: 1.32 mmol/L (ref 0.5–1.9)

## 2016-04-18 MED ORDER — POTASSIUM CHLORIDE CRYS ER 20 MEQ PO TBCR
40.0000 meq | EXTENDED_RELEASE_TABLET | Freq: Once | ORAL | Status: AC
  Administered 2016-04-18: 40 meq via ORAL
  Filled 2016-04-18: qty 2

## 2016-04-18 MED ORDER — LEVOFLOXACIN 750 MG PO TABS
750.0000 mg | ORAL_TABLET | Freq: Once | ORAL | Status: AC
  Administered 2016-04-18: 750 mg via ORAL
  Filled 2016-04-18: qty 1

## 2016-04-18 MED ORDER — MAGNESIUM SULFATE 2 GM/50ML IV SOLN
2.0000 g | Freq: Once | INTRAVENOUS | Status: AC
  Administered 2016-04-18: 2 g via INTRAVENOUS
  Filled 2016-04-18: qty 50

## 2016-04-18 MED ORDER — LEVOFLOXACIN 750 MG PO TABS: 750.0000 mg | ORAL_TABLET | Freq: Every day | ORAL | 0 refills | Status: DC

## 2016-04-18 MED ORDER — SODIUM CHLORIDE 0.9 % IV BOLUS (SEPSIS)
1000.0000 mL | Freq: Once | INTRAVENOUS | Status: AC
  Administered 2016-04-18: 1000 mL via INTRAVENOUS

## 2016-04-18 MED ORDER — ONDANSETRON HCL 4 MG/2ML IJ SOLN
4.0000 mg | Freq: Once | INTRAMUSCULAR | Status: AC
  Administered 2016-04-18: 4 mg via INTRAVENOUS
  Filled 2016-04-18: qty 2

## 2016-04-18 NOTE — ED Notes (Signed)
Bed: WA21 Expected date:  Expected time:  Means of arrival:  Comments: 

## 2016-04-18 NOTE — ED Triage Notes (Signed)
Per family-states flu like symptoms since Monday-body aches, low grade fever, family states she was seeing numbers on the wall-patient states she felt better yesterday after having BM-states this am, symptoms returned

## 2016-04-18 NOTE — Discharge Instructions (Signed)
Your x-ray showed concerning signs for pneumonia. We have given you a dose of antibiotics in the ED today. I am sending her home with antibiotics to take once a day for the next 4 days. Please continue to stay hydrated. Your potassium was slightly low in the ED. I have attached instructions on increasing dietary potassium. Please follow-up with your primary care doctor next week to have repeat blood work and a repeat x-ray performed. Return to the ED if you develop worsening symptoms including short of breath, chest pain with exertion, worsening fevers or for any other reason.

## 2016-04-18 NOTE — ED Provider Notes (Signed)
[facility]-EMERGENCY DEPT Provider Note   CSN: [id number] Arrival date & time: 04/18/16  1027  By signing my name below, I, Rhonda Moore, attest that this documentation has been prepared under the direction and in the presence of Rhonda Kubayler Leaphart, PA-C. Electronically Signed: Teofilo PodMatthew P. Moore, ED Scribe. 04/18/2016. 11:19 AM.    History   Chief Complaint Chief Complaint  Patient presents with  . flu like symptoms    The history is provided by the patient. No language interpreter was used.   HPI Comments:  Rhonda Moore is a 82 y.o. female who presents to the Emergency Department complaining of multiple flu-like symptoms x 2 days. Family reports that pt has had associated generalized body aches, low-grade fever, nausea, diarrhea, chills, congestion, cough, rhinorrhea, generalized abdominal pain, constipation, sore throat. Pt states that she is not currently nauseous. Daughter states that the symptoms started to improve yesterday, but they have worsened today. Pt's son lives with her. Daughter reports  was having visual hallucinations of random letters on the wall 2 nights ago. Pt states she remembers seeing the letters U and R, and she and her children note that her memory is typically not very clear as of 6 months ago with possible onset dementia. They also noted that patient has had decreased appetite for the past 6 months. They have been trying medicine by pcp to stimulate appetite. Daughter also reports that pt fell 1 week ago, landing on her buttocks due to a mechanical fall. She denies hitting head or loc. Pt has used flonase for congestion with mild relief. Pt denies fevers, ha, vision changes, lightheadedness, dizziness, cp, sob, vomiting, urinary symptoms, blood in stool, numbness/tinglng. Denies any sick contacts.  Past Medical History:  Diagnosis Date  . Anemia   . Bronchitis   . Cardiomegaly   . Hyperlipidemia   . Hypertension   . Left carotid bruit   . Palpitation   .  Shingles 2007  . Thrombocytopenia (HCC)   . Vitamin D deficiency     Patient Active Problem List   Diagnosis Date Noted  . Non-allergic rhinitis 11/02/2011  . HYPERLIPIDEMIA 11/07/2009  . HYPERTENSION 11/07/2009  . TOBACCO USE, QUIT 11/07/2009  . COPD with chronic bronchitis (HCC) 11/04/2009    Past Surgical History:  Procedure Laterality Date  . ABDOMINAL HYSTERECTOMY    . CATARACT EXTRACTION    . CERVICAL SPINE SURGERY     x 2  . CHOLECYSTECTOMY    . DENTAL SURGERY    . EXTERNAL EAR SURGERY    . JOINT REPLACEMENT    . left hip replacement    . LUMBAR SPINE SURGERY     x 2  . SHOULDER SURGERY      OB History    No data available       Home Medications    Prior to Admission medications   Medication Sig Start Date End Date Taking? Authorizing Provider  amLODipine (NORVASC) 5 MG tablet Take 1 tablet by mouth daily. 05/14/12   Historical Provider, MD  aspirin 81 MG tablet Take 81 mg by mouth daily.      Historical Provider, MD  atenolol (TENORMIN) 50 MG tablet Take 50 mg by mouth 2 (two) times daily.     Historical Provider, MD  budesonide-formoterol (SYMBICORT) 160-4.5 MCG/ACT inhaler Inhale 2 puffs into the lungs 2 (two) times daily. Rinse mouth 12/21/13 03/24/15  Waymon Budgelinton D Young, MD  Cholecalciferol (VITAMIN D-400 PO) Take 1 tablet by mouth 2 (two) times daily.  Historical Provider, MD  citalopram (CELEXA) 20 MG tablet Take 1 tablet by mouth daily. 03/11/14   Historical Provider, MD  ipratropium (ATROVENT) 0.06 % nasal spray 1-2 sprays each nostril, up to 4 times daily as needed 10/25/11 03/24/15  Waymon Budgelinton D Young, MD  levothyroxine (SYNTHROID, LEVOTHROID) 50 MCG tablet Take 1 tablet by mouth daily. 03/22/14   Historical Provider, MD  losartan (COZAAR) 50 MG tablet Take 1 tablet by mouth daily. 05/26/12   Historical Provider, MD  traMADol (ULTRAM) 50 MG tablet Take 50 mg by mouth 3 (three) times daily as needed for moderate pain.  04/24/12   Historical Provider, MD     Family History No family history on file.  Social History Social History  Substance Use Topics  . Smoking status: Former Smoker    Types: Cigarettes    Quit date: 05/07/1948  . Smokeless tobacco: Current User    Types: Snuff  . Alcohol use No     Allergies   Hydrocodone and Penicillins   Review of Systems Review of Systems  Constitutional: Positive for chills and fever. Negative for activity change and appetite change.  HENT: Positive for congestion, rhinorrhea and sore throat.   Respiratory: Positive for cough.   Gastrointestinal: Positive for abdominal pain, constipation, diarrhea and nausea. Negative for blood in stool and vomiting.  Genitourinary: Negative for dysuria.  Musculoskeletal: Positive for myalgias.  Psychiatric/Behavioral: Positive for hallucinations.  All other systems reviewed and are negative.    Physical Exam Updated Vital Signs BP 125/81 (BP Location: Left Arm)   Pulse 100   Temp 98.2 F (36.8 C) (Oral)   Resp 16   SpO2 100%   Physical Exam  Constitutional: She is oriented to person, place, and time. She appears well-developed and well-nourished. No distress.  HENT:  Head: Normocephalic and atraumatic.  Right Ear: Tympanic membrane, external ear and ear canal normal.  Left Ear: Tympanic membrane, external ear and ear canal normal.  Nose: Mucosal edema and rhinorrhea present.  Mouth/Throat: Uvula is midline. Mucous membranes are dry. Posterior oropharyngeal edema and posterior oropharyngeal erythema present. No oropharyngeal exudate.  Eyes: Conjunctivae and EOM are normal. Pupils are equal, round, and reactive to light. Right eye exhibits no discharge. Left eye exhibits no discharge. No scleral icterus.  Neck: Normal range of motion. Neck supple. No thyromegaly present.  Cardiovascular: Regular rhythm, normal heart sounds and intact distal pulses.  Tachycardia present.  Exam reveals no gallop and no friction rub.   No murmur heard. Pulses:       Radial pulses are 2+ on the right side, and 2+ on the left side.       Dorsalis pedis pulses are 2+ on the right side, and 2+ on the left side.  Pulmonary/Chest: Effort normal and breath sounds normal. No respiratory distress.  Abdominal: Soft. Bowel sounds are normal. She exhibits no distension. There is generalized tenderness. There is no rigidity, no rebound, no guarding and no CVA tenderness.  Musculoskeletal: Normal range of motion.  Lymphadenopathy:    She has no cervical adenopathy.  Neurological: She is alert and oriented to person, place, and time. She has normal strength. No cranial nerve deficit or sensory deficit. GCS eye subscore is 4. GCS verbal subscore is 5. GCS motor subscore is 6.  Skin: Skin is warm and dry. Capillary refill takes less than 2 seconds.  Psychiatric: She has a normal mood and affect.  Nursing note and vitals reviewed.    ED Treatments / Results  DIAGNOSTIC STUDIES:  Oxygen Saturation is 100% on RA, normal by my interpretation.    COORDINATION OF CARE:  11:19 AM Discussed treatment plan with pt at bedside and pt agreed to plan.   Labs (all labs ordered are listed, but only abnormal results are displayed) Labs Reviewed  COMPREHENSIVE METABOLIC PANEL - Abnormal; Notable for the following:       Result Value   Potassium 3.3 (*)    Chloride 98 (*)    Glucose, Bld 118 (*)    AST 51 (*)    GFR calc non Af Amer 57 (*)    All other components within normal limits  CBC WITH DIFFERENTIAL/PLATELET - Abnormal; Notable for the following:    WBC 2.4 (*)    RBC 3.48 (*)    Hemoglobin 11.2 (*)    HCT 33.0 (*)    Platelets 104 (*)    Neutro Abs 1.3 (*)    All other components within normal limits  URINALYSIS, ROUTINE W REFLEX MICROSCOPIC - Abnormal; Notable for the following:    Ketones, ur 5 (*)    All other components within normal limits  MAGNESIUM - Abnormal; Notable for the following:    Magnesium 1.6 (*)    All other components within normal  limits  URINE CULTURE  RAPID STREP SCREEN (NOT AT [facility])  CULTURE, GROUP A STREP ([facility])  PROTIME-INR  INFLUENZA PANEL BY PCR (TYPE A & B, H1N1)  I-STAT CG4 LACTIC ACID, ED  I-STAT TROPOININ, ED    EKG  EKG Interpretation  Date/Time:  Wednesday April 18 2016 11:52:37 EST Ventricular Rate:  90 PR Interval:    QRS Duration: 79 QT Interval:  325 QTC Calculation: 391 R Axis:   59 Text Interpretation:  Atrial fibrillation Probable anterior infarct, age indeterminate Baseline wander in lead(s) V2 Nonspecific T wave changes, TW flattening new from prior Otherwise no significant change Confirmed by [facility] MD, ERIN ([id number]) on 04/18/2016 12:09:39 PM       Radiology Dg Chest 2 View  Result Date: 04/18/2016 CLINICAL DATA:  Flu-like symptoms for the past 3 days EXAM: CHEST  2 VIEW COMPARISON:  PA and lateral chest x-ray of December 06, 2015 FINDINGS: The patient is rotated on today's study. The cardiac silhouette remains enlarged. The pulmonary vascularity is not engorged. The lungs are well-expanded. There is mild hemidiaphragm flattening. The lung markings are coarse in the lower lobes greatest on the right. There is no pleural effusion or pneumothorax. There is calcification within the wall of the aortic arch. There is moderate dextrocurvature centered in the mid thoracic spine. There are degenerative changes of the left shoulder. IMPRESSION: Chronic bronchitic changes. Atelectasis or pneumonia in the right lower lobe. Followup PA and lateral chest X-ray is recommended in 3-4 weeks following trial of antibiotic therapy to ensure resolution and exclude underlying malignancy. Cardiomegaly without pulmonary edema. Thoracic aortic atherosclerosis. Electronically Signed   By: David  [place] M.D.   On: 04/18/2016 13:23    Procedures Procedures (including critical care time)  Medications Ordered in ED Medications  magnesium sulfate IVPB 2 g 50 mL (0 g Intravenous Stopped 04/18/16 1616)   potassium chloride SA (K-DUR,KLOR-CON) CR tablet 40 mEq (40 mEq Oral Given 04/18/16 1312)  ondansetron (ZOFRAN) injection 4 mg (4 mg Intravenous Given 04/18/16 1312)  sodium chloride 0.9 % bolus 1,000 mL (0 mLs Intravenous Stopped 04/18/16 1515)  levofloxacin (LEVAQUIN) tablet 750 mg (750 mg Oral Given 04/18/16 1539)     Initial Impression / Assessment and  Plan / ED Course  I have reviewed the triage vital signs and the nursing notes.  Pertinent labs & imaging results that were available during my care of the patient were reviewed by me and considered in my medical decision making (see chart for details).  Clinical Course   Patient has been diagnosed with CAP via chest xray. Pt is not ill appearing, immunocompromised, and does not have multiple co morbidities, therefore I feel like the they can be treated as an OP with abx therapy. She was mildly dehydrated. Potassium and mg slightly low. Replaced in ED. Patient was initially orthostatic. IV fluids given with signs improvement in orthostatic. Patient feel much improved on repeat exam. Patient given dose of Levaquin in the ED and will be sent home on for a total of 5 day treatment. No leukocytosis noted. Lactic normal. UA without signs of uti. All other labs were unremarkable. EKG without acute changes. Troponin was negative. Denies any cp or sob. She is hemodynamically stable and in NAD. Pt has been advised to return to the ED if symptoms worsen or they do not improve. Pt verbalizes understanding and is agreeable with plan. Patient seen and examined by Dr. Dalene Seltzer who is agreeable to the above plan.   Final Clinical Impressions(s) / ED Diagnoses   Final diagnoses:  Hypokalemia  Hypomagnesemia  Dehydration  Community acquired pneumonia of right lower lobe of lung (HCC)    New Prescriptions Discharge Medication List as of 04/18/2016  3:59 PM    START taking these medications   Details  levofloxacin (LEVAQUIN) 750 MG tablet Take 1  tablet (750 mg total) by mouth daily., Starting Wed 04/18/2016, Print      I personally performed the services described in this documentation, which was scribed in my presence. The recorded information has been reviewed and is accurate.     Rise Mu, PA-C 04/19/16 2311    Alvira Monday, MD 04/22/16 1256

## 2016-04-19 LAB — URINE CULTURE: Culture: NO GROWTH

## 2016-04-20 LAB — CULTURE, GROUP A STREP (THRC)

## 2016-04-26 ENCOUNTER — Emergency Department (HOSPITAL_COMMUNITY)
Admission: EM | Admit: 2016-04-26 | Discharge: 2016-04-26 | Disposition: A | Discharge status: Home / Self Care | Payer: Medicare HMO | Attending: Emergency Medicine | Admitting: Emergency Medicine

## 2016-04-26 ENCOUNTER — Emergency Department (HOSPITAL_COMMUNITY): Payer: Medicare HMO

## 2016-04-26 ENCOUNTER — Encounter (HOSPITAL_COMMUNITY): Payer: Self-pay | Admitting: Emergency Medicine

## 2016-04-26 DIAGNOSIS — R630 Anorexia: Secondary | ICD-10-CM | POA: Diagnosis not present

## 2016-04-26 DIAGNOSIS — Z87891 Personal history of nicotine dependence: Secondary | ICD-10-CM | POA: Diagnosis not present

## 2016-04-26 DIAGNOSIS — E860 Dehydration: Secondary | ICD-10-CM | POA: Insufficient documentation

## 2016-04-26 DIAGNOSIS — I10 Essential (primary) hypertension: Secondary | ICD-10-CM | POA: Diagnosis not present

## 2016-04-26 DIAGNOSIS — R531 Weakness: Secondary | ICD-10-CM | POA: Diagnosis present

## 2016-04-26 DIAGNOSIS — R0689 Other abnormalities of breathing: Secondary | ICD-10-CM | POA: Diagnosis not present

## 2016-04-26 DIAGNOSIS — Z7982 Long term (current) use of aspirin: Secondary | ICD-10-CM | POA: Insufficient documentation

## 2016-04-26 DIAGNOSIS — J449 Chronic obstructive pulmonary disease, unspecified: Secondary | ICD-10-CM | POA: Insufficient documentation

## 2016-04-26 LAB — URINALYSIS, ROUTINE W REFLEX MICROSCOPIC
BACTERIA UA: NONE SEEN
BILIRUBIN URINE: NEGATIVE
Glucose, UA: NEGATIVE mg/dL
Hgb urine dipstick: NEGATIVE
KETONES UR: NEGATIVE mg/dL
LEUKOCYTES UA: NEGATIVE
Nitrite: NEGATIVE
Protein, ur: 30 mg/dL — AB
Specific Gravity, Urine: 1.01 (ref 1.005–1.030)
pH: 6 (ref 5.0–8.0)

## 2016-04-26 LAB — CBC
HEMATOCRIT: 34.1 % — AB (ref 36.0–46.0)
Hemoglobin: 11.6 g/dL — ABNORMAL LOW (ref 12.0–15.0)
MCH: 31.5 pg (ref 26.0–34.0)
MCHC: 34 g/dL (ref 30.0–36.0)
MCV: 92.7 fL (ref 78.0–100.0)
Platelets: 200 10*3/uL (ref 150–400)
RBC: 3.68 MIL/uL — AB (ref 3.87–5.11)
RDW: 13.6 % (ref 11.5–15.5)
WBC: 8.8 10*3/uL (ref 4.0–10.5)

## 2016-04-26 LAB — COMPREHENSIVE METABOLIC PANEL
ALT: 15 U/L (ref 14–54)
AST: 31 U/L (ref 15–41)
Albumin: 4.3 g/dL (ref 3.5–5.0)
Alkaline Phosphatase: 42 U/L (ref 38–126)
Anion gap: 9 (ref 5–15)
BUN: 17 mg/dL (ref 6–20)
CHLORIDE: 98 mmol/L — AB (ref 101–111)
CO2: 28 mmol/L (ref 22–32)
Calcium: 9.3 mg/dL (ref 8.9–10.3)
Creatinine, Ser: 0.94 mg/dL (ref 0.44–1.00)
GFR calc Af Amer: >60 mL/min (ref >60)
GFR, EST NON AFRICAN AMERICAN: 53 mL/min — AB (ref >60)
Glucose, Bld: 100 mg/dL — ABNORMAL HIGH (ref 65–99)
POTASSIUM: 3.5 mmol/L (ref 3.5–5.1)
SODIUM: 135 mmol/L (ref 135–145)
Total Bilirubin: 0.9 mg/dL (ref 0.3–1.2)
Total Protein: 7.4 g/dL (ref 6.5–8.1)

## 2016-04-26 LAB — I-STAT CG4 LACTIC ACID, ED: LACTIC ACID, VENOUS: 0.99 mmol/L (ref 0.5–1.9)

## 2016-04-26 LAB — BRAIN NATRIURETIC PEPTIDE: B NATRIURETIC PEPTIDE 5: 363.2 pg/mL — AB (ref 0.0–100.0)

## 2016-04-26 LAB — TSH: TSH: 3.479 u[IU]/mL (ref 0.350–4.500)

## 2016-04-26 LAB — LIPASE, BLOOD: LIPASE: 39 U/L (ref 11–51)

## 2016-04-26 MED ORDER — SODIUM CHLORIDE 0.9 % IV BOLUS (SEPSIS)
1000.0000 mL | Freq: Once | INTRAVENOUS | Status: AC
  Administered 2016-04-26: 1000 mL via INTRAVENOUS

## 2016-04-26 NOTE — Discharge Instructions (Signed)
Please continue to encourage eating and drinking. Please try to stay hydrated. Please follow-up with your primary care physician in several days for follow-up. Do not feel you have a recurrent pneumonia or any evidence of organ injury from dehydration. You have been rehydrated with IV fluids, please try and stay hydrated. If any symptoms return or worsen, please return to the nearest emergency department.

## 2016-04-26 NOTE — ED Triage Notes (Signed)
Per son/patient-states she has not had a BM for a week-states not eating and drinking-states aching all over-states being treated for PNA-PCP sent her here for admission

## 2016-04-26 NOTE — ED Notes (Signed)
ED Provider at bedside. 

## 2016-04-26 NOTE — ED Notes (Signed)
Pt ambulatory and independent at discharge.  Discharged with son by side.  Both verbalized understanding of discharge instructions.

## 2016-04-26 NOTE — ED Provider Notes (Signed)
[facility]-EMERGENCY DEPT Provider Note   CSN: [id number] Arrival date & time: 04/26/16  1721     History   Chief Complaint Chief Complaint  Patient presents with  . poor PO intake    HPI Rhonda Moore is a 82 y.o. female With a past medical history significant for hypertension, Hyperlipidemia, and recent pneumonia status post antibiotics who presents with continued cough, chills, decrease in PO intake, rhinorrhea, and constipation. Patient is accompanied by her son who reports that two weeks ago, patient was diagnosed with pneumonia and was treated with levofloxacin. He reports that patient did better but over the last few days had return of chills and cough. Patient has a call with clear productive sputum. She also has had some rhinorrhea and congestion. Of note, he reports that he has recently had a cold. Patient has had decrease in PO intake this week and is also not had a bowel movement this week. Patient denies abdominal pain or abdominal cramping. Patient denies any nausea or vomiting. She says that she just does not have an appetite. Patient is however able to drink fluids. Patient denies any dysuria or changing urination. Patient reports she is still passing flatus.  When PCP was called with her symptoms, he instructed her to go to the ED for evaluation to look for dehydration, recurrent pneumonia, or other abnormalities.   The history is provided by the patient, a relative, medical records and the spouse. No language interpreter was used.  Cough  This is a recurrent problem. The current episode started more than 2 days ago. The problem occurs constantly. The problem has not changed since onset.The cough is productive of sputum (clear sputum). Maximum temperature: subjective. Associated symptoms include chills, rhinorrhea and myalgias (all over). Pertinent negatives include no chest pain, no weight loss, no headaches, no shortness of breath and no wheezing. She has tried nothing for the  symptoms. The treatment provided no relief. Risk factors: sick contact with son who has a cold. Her past medical history is significant for pneumonia (recent PNA s/p abx).    Past Medical History:  Diagnosis Date  . Anemia   . Bronchitis   . Cardiomegaly   . Hyperlipidemia   . Hypertension   . Left carotid bruit   . Palpitation   . Shingles 2007  . Thrombocytopenia (HCC)   . Vitamin D deficiency     Patient Active Problem List   Diagnosis Date Noted  . Non-allergic rhinitis 11/02/2011  . HYPERLIPIDEMIA 11/07/2009  . HYPERTENSION 11/07/2009  . TOBACCO USE, QUIT 11/07/2009  . COPD with chronic bronchitis (HCC) 11/04/2009    Past Surgical History:  Procedure Laterality Date  . ABDOMINAL HYSTERECTOMY    . CATARACT EXTRACTION    . CERVICAL SPINE SURGERY     x 2  . CHOLECYSTECTOMY    . DENTAL SURGERY    . EXTERNAL EAR SURGERY    . JOINT REPLACEMENT    . left hip replacement    . LUMBAR SPINE SURGERY     x 2  . SHOULDER SURGERY      OB History    No data available       Home Medications    Prior to Admission medications   Medication Sig Start Date End Date Taking? Authorizing Provider  amLODipine (NORVASC) 5 MG tablet Take 1 tablet by mouth daily. 05/14/12   Historical Provider, MD  aspirin 81 MG tablet Take 81 mg by mouth daily.      Historical Provider,  MD  atenolol (TENORMIN) 50 MG tablet Take 50 mg by mouth 2 (two) times daily.     Historical Provider, MD  budesonide-formoterol (SYMBICORT) 160-4.5 MCG/ACT inhaler Inhale 2 puffs into the lungs 2 (two) times daily. Rinse mouth 12/21/13 04/18/16  Waymon Budge, MD  Cholecalciferol (VITAMIN D-400 PO) Take 1 tablet by mouth 2 (two) times daily.    Historical Provider, MD  citalopram (CELEXA) 20 MG tablet Take 1 tablet by mouth daily. 03/11/14   Historical Provider, MD  ibuprofen (ADVIL,MOTRIN) 200 MG tablet Take 400 mg by mouth every 6 (six) hours as needed for moderate pain.    Historical Provider, MD  ipratropium  (ATROVENT) 0.06 % nasal spray 1-2 sprays each nostril, up to 4 times daily as needed Patient taking differently: Place 1 spray into both nostrils 2 (two) times daily as needed for rhinitis.  10/25/11 04/18/16  Waymon Budge, MD  levofloxacin (LEVAQUIN) 750 MG tablet Take 1 tablet (750 mg total) by mouth daily. 04/18/16   Rise Mu, PA-C  levothyroxine (SYNTHROID, LEVOTHROID) 50 MCG tablet Take 1 tablet by mouth daily. 03/22/14   Historical Provider, MD  losartan (COZAAR) 50 MG tablet Take 1 tablet by mouth daily. 05/26/12   Historical Provider, MD  Multiple Vitamins-Minerals (ELDERTONIC PO) Take 1 tablet by mouth daily.    Historical Provider, MD  traMADol (ULTRAM) 50 MG tablet Take 50 mg by mouth 3 (three) times daily as needed for moderate pain.  04/24/12   Historical Provider, MD    Family History No family history on file.  Social History Social History  Substance Use Topics  . Smoking status: Former Smoker    Types: Cigarettes    Quit date: 05/07/1948  . Smokeless tobacco: Current User    Types: Snuff  . Alcohol use No     Allergies   Hydrocodone and Penicillins   Review of Systems Review of Systems  Constitutional: Positive for chills, fatigue and fever (subjective). Negative for diaphoresis and weight loss.  HENT: Positive for congestion and rhinorrhea.   Eyes: Negative for visual disturbance.  Respiratory: Positive for cough. Negative for chest tightness, shortness of breath, wheezing and stridor.   Cardiovascular: Negative for chest pain, palpitations and leg swelling.  Gastrointestinal: Positive for constipation. Negative for abdominal pain, diarrhea, nausea and vomiting.  Genitourinary: Negative for difficulty urinating, dysuria, flank pain and frequency.  Musculoskeletal: Positive for myalgias (all over). Negative for back pain, neck pain and neck stiffness.  Skin: Negative for rash and wound.  Neurological: Negative for weakness, light-headedness, numbness  and headaches.  Psychiatric/Behavioral: Negative for agitation.     Physical Exam Updated Vital Signs BP 110/93 (BP Location: Right Arm)   Pulse 83   Temp 98 F (36.7 C) (Oral)   Resp 18   Wt 110 lb (49.9 kg)   SpO2 99%   BMI 18.88 kg/m   Physical Exam  Constitutional: She is oriented to person, place, and time. She appears well-developed and well-nourished. No distress.  HENT:  Head: Normocephalic and atraumatic.  Nose: Rhinorrhea present.  Mouth/Throat: Oropharynx is clear and moist. No oropharyngeal exudate.  Eyes: Conjunctivae and EOM are normal. Pupils are equal, round, and reactive to light.  Neck: Normal range of motion. Neck supple.  Cardiovascular: Normal rate, regular rhythm and intact distal pulses.   No murmur heard. Pulmonary/Chest: Effort normal and breath sounds normal. No stridor. No respiratory distress. She has no wheezes. She has no rales. She exhibits no tenderness.  Abdominal: Soft.  She exhibits no distension. There is no tenderness. There is no guarding.  Musculoskeletal: She exhibits no edema or tenderness.  Neurological: She is alert and oriented to person, place, and time. She is not disoriented. She displays no tremor. No cranial nerve deficit or sensory deficit. She exhibits normal muscle tone. Coordination normal. GCS eye subscore is 4. GCS verbal subscore is 5. GCS motor subscore is 6.  Skin: Skin is warm and dry. Capillary refill takes less than 2 seconds. No rash noted. She is not diaphoretic. No erythema.  Psychiatric: She has a normal mood and affect.  Nursing note and vitals reviewed.    ED Treatments / Results  Labs (all labs ordered are listed, but only abnormal results are displayed) Labs Reviewed  COMPREHENSIVE METABOLIC PANEL - Abnormal; Notable for the following:       Result Value   Chloride 98 (*)    Glucose, Bld 100 (*)    GFR calc non Af Amer 53 (*)    All other components within normal limits  CBC - Abnormal; Notable for the  following:    RBC 3.68 (*)    Hemoglobin 11.6 (*)    HCT 34.1 (*)    All other components within normal limits  URINALYSIS, ROUTINE W REFLEX MICROSCOPIC - Abnormal; Notable for the following:    Protein, ur 30 (*)    Squamous Epithelial / LPF 0-5 (*)    All other components within normal limits  BRAIN NATRIURETIC PEPTIDE - Abnormal; Notable for the following:    B Natriuretic Peptide 363.2 (*)    All other components within normal limits  LIPASE, BLOOD  TSH  I-STAT CG4 LACTIC ACID, ED    EKG  EKG Interpretation  Date/Time:  Thursday April 26 2016 20:11:37 EST Ventricular Rate:  71 PR Interval:    QRS Duration: 95 QT Interval:  416 QTC Calculation: 453 R Axis:   68 Text Interpretation:  Atrial fibrillation Ventricular premature complex Anterior infarct, old No STEMI Confirmed by Isiaih Hollenbach MD, Annais Crafts [phone]) on 04/26/2016 9:21:12 PM Also confirmed by Rush LandmarkEGELER MD, Breyon Sigg [phone]([id number]), editor [place]  CCT, BEVERLY ([id number])  on 04/27/2016 6:59:26 AM       Radiology Dg Abdomen Acute W/chest  Result Date: 04/26/2016 CLINICAL DATA:  82 year old female with cough and recent pneumonia. EXAM: DG ABDOMEN ACUTE W/ 1V CHEST COMPARISON:  Chest radiograph dated 04/18/2016 FINDINGS: There is stable moderate cardiomegaly. No vascular congestion or edema. There is diffuse chronic appearing interstitial coarsening and bronchiectatic changes. Bibasilar streaky densities, right there are left likely atelectatic changes. Infiltrate is less likely. Clinical correlation is recommended. There is no focal consolidation, pleural effusion, or pneumothorax. Air is noted within the stomach and throughout the colon. No bowel dilatation or evidence of obstruction. No free air or radiopaque calculi. Right upper quadrant cholecystectomy clips and pelvic phleboliths noted. There is osteopenia, scoliosis and degenerative changes of the spine. Left hip arthroplasty. No acute fracture. IMPRESSION: Chronic  interstitial coarsening and bronchiectatic changes. No definite focal consolidation. Moderate cardiomegaly similar to prior radiograph. No bowel obstruction. Electronically Signed   By: Elgie CollardArash  Radparvar M.D.   On: 04/26/2016 21:07    Procedures Procedures (including critical care time)  Medications Ordered in ED Medications  sodium chloride 0.9 % bolus 1,000 mL (0 mLs Intravenous Stopped 04/26/16 2116)     Initial Impression / Assessment and Plan / ED Course  I have reviewed the triage vital signs and the nursing notes.  Pertinent labs & imaging results that  were available during my care of the patient were reviewed by me and considered in my medical decision making (see chart for details).  Clinical Course    Rhonda Moore is a 82 y.o. female With a past medical history significant for hypertension, Hyperlipidemia, and recent pneumonia status post antibiotics who presents with continued cough, chills, decrease in PO intake, rhinorrhea, and constipation.  History and exam nursing above. On exam, patient had clear breath sounds. Some audible congestion invisible rhinorrhea were appreciated. No focal neurologic deficits. Chest nontender. Abdomen nontender. No unilateral or bilateral leg pain or swelling. Patient is alert and oriented and had no other abnormality's on exam.  Given decreased PO intake, suspect dehydration. Fluids were given. Workup was initiated for recurrent pneumonia or other occult infection like UTI. Laboratory testing was also performed.   EKG showed Similar atrial Fibrillation to prior. No evidence of STEMI. Chest x-ray shows no evidence of recurrent pneumonia. TSH normal. Lactic acid nonelevated. BNP slightly elevated however there was not a previous number to compare. CMP showed electrolytes that were grossly unremarkable. Lipase nonelevated. CBC showed improved hemoglobin and no leukocytosis. Urinalysis showed no evidence of UTI.  Given report of no bowel movements,  images were obtained of the chest and abdomen. No evidence of bowel obstruction was seen on plain film.  Patient reported feeling better after fluids. Suspend dehydration in the setting of likely viral URI acquired from son who has been helping to take care of her since her pneumonia. No evidence of bowel obstruction or indoor damage from dehydration. Do not feel patient requires admission given reassuring workup and exam. Patient given instructions on maintaining hydration as well as plans to follow up with PCP.   Patient and family given strict return precautions for any new or worsening symptoms including findings of our obstruction or worsened respiratory status. They agreed with plan of discharge and plan of care. Patient was discharged in good condition after improvement in fatigue and malaise after rehydration.         Final Clinical Impressions(s) / ED Diagnoses   Final diagnoses:  Dehydration  Decreased appetite    New Prescriptions Discharge Medication List as of 04/26/2016 10:54 PM      Clinical Impression: 1. Dehydration   2. Decreased appetite     Disposition: Discharge  Condition: Good  I have discussed the results, Dx and Tx plan with the pt(& family if present). He/she/they expressed understanding and agree(s) with the plan. Discharge instructions discussed at great length. Strict return precautions discussed and pt &/or family have verbalized understanding of the instructions. No further questions at time of discharge.    Discharge Medication List as of 04/26/2016 10:54 PM      Follow Up: Tor Netters. Kristen Loader, MD [id number] [place] [address] [phone]  Schedule an appointment as soon as possible for a visit    [facility] [address] [id number] mc [place] [address] [phone]  If symptoms worsen     Heide Scales, MD 04/27/16 1423

## 2017-03-23 ENCOUNTER — Emergency Department (HOSPITAL_COMMUNITY): Payer: Medicare PPO

## 2017-03-23 ENCOUNTER — Encounter (HOSPITAL_COMMUNITY): Payer: Self-pay | Admitting: Nurse Practitioner

## 2017-03-23 ENCOUNTER — Emergency Department (HOSPITAL_COMMUNITY)
Admission: EM | Admit: 2017-03-23 | Discharge: 2017-03-23 | Disposition: A | Discharge status: Home / Self Care | Payer: Medicare PPO | Attending: Emergency Medicine | Admitting: Emergency Medicine

## 2017-03-23 ENCOUNTER — Other Ambulatory Visit: Payer: Self-pay

## 2017-03-23 DIAGNOSIS — R600 Localized edema: Secondary | ICD-10-CM | POA: Insufficient documentation

## 2017-03-23 DIAGNOSIS — S52602A Unspecified fracture of lower end of left ulna, initial encounter for closed fracture: Secondary | ICD-10-CM | POA: Insufficient documentation

## 2017-03-23 DIAGNOSIS — S0083XA Contusion of other part of head, initial encounter: Secondary | ICD-10-CM | POA: Insufficient documentation

## 2017-03-23 DIAGNOSIS — I4891 Unspecified atrial fibrillation: Secondary | ICD-10-CM | POA: Diagnosis not present

## 2017-03-23 DIAGNOSIS — S0990XA Unspecified injury of head, initial encounter: Secondary | ICD-10-CM | POA: Diagnosis not present

## 2017-03-23 DIAGNOSIS — W19XXXA Unspecified fall, initial encounter: Secondary | ICD-10-CM

## 2017-03-23 DIAGNOSIS — W01198A Fall on same level from slipping, tripping and stumbling with subsequent striking against other object, initial encounter: Secondary | ICD-10-CM | POA: Diagnosis not present

## 2017-03-23 DIAGNOSIS — S52572A Other intraarticular fracture of lower end of left radius, initial encounter for closed fracture: Secondary | ICD-10-CM | POA: Insufficient documentation

## 2017-03-23 DIAGNOSIS — S0993XA Unspecified injury of face, initial encounter: Secondary | ICD-10-CM

## 2017-03-23 DIAGNOSIS — Y92002 Bathroom of unspecified non-institutional (private) residence single-family (private) house as the place of occurrence of the external cause: Secondary | ICD-10-CM | POA: Insufficient documentation

## 2017-03-23 DIAGNOSIS — F17290 Nicotine dependence, other tobacco product, uncomplicated: Secondary | ICD-10-CM | POA: Diagnosis not present

## 2017-03-23 DIAGNOSIS — Z7982 Long term (current) use of aspirin: Secondary | ICD-10-CM | POA: Diagnosis not present

## 2017-03-23 DIAGNOSIS — S6992XA Unspecified injury of left wrist, hand and finger(s), initial encounter: Secondary | ICD-10-CM | POA: Diagnosis present

## 2017-03-23 DIAGNOSIS — Z79899 Other long term (current) drug therapy: Secondary | ICD-10-CM | POA: Diagnosis not present

## 2017-03-23 DIAGNOSIS — Y9301 Activity, walking, marching and hiking: Secondary | ICD-10-CM | POA: Diagnosis not present

## 2017-03-23 DIAGNOSIS — Z96642 Presence of left artificial hip joint: Secondary | ICD-10-CM | POA: Diagnosis not present

## 2017-03-23 DIAGNOSIS — J449 Chronic obstructive pulmonary disease, unspecified: Secondary | ICD-10-CM | POA: Insufficient documentation

## 2017-03-23 DIAGNOSIS — Y999 Unspecified external cause status: Secondary | ICD-10-CM | POA: Diagnosis not present

## 2017-03-23 DIAGNOSIS — I10 Essential (primary) hypertension: Secondary | ICD-10-CM | POA: Insufficient documentation

## 2017-03-23 LAB — CBC WITH DIFFERENTIAL/PLATELET
Basophils Absolute: 0 10*3/uL (ref 0.0–0.1)
Basophils Relative: 0 %
EOS ABS: 0 10*3/uL (ref 0.0–0.7)
Eosinophils Relative: 0 %
HCT: 24.4 % — ABNORMAL LOW (ref 36.0–46.0)
HEMOGLOBIN: 8.1 g/dL — AB (ref 12.0–15.0)
LYMPHS ABS: 0.8 10*3/uL (ref 0.7–4.0)
Lymphocytes Relative: 14 %
MCH: 32.7 pg (ref 26.0–34.0)
MCHC: 33.2 g/dL (ref 30.0–36.0)
MCV: 98.4 fL (ref 78.0–100.0)
Monocytes Absolute: 1.5 10*3/uL — ABNORMAL HIGH (ref 0.1–1.0)
Monocytes Relative: 27 %
NEUTROS ABS: 3.2 10*3/uL (ref 1.7–7.7)
NEUTROS PCT: 59 %
Platelets: 124 10*3/uL — ABNORMAL LOW (ref 150–400)
RBC: 2.48 MIL/uL — AB (ref 3.87–5.11)
RDW: 14.8 % (ref 11.5–15.5)
WBC: 5.5 10*3/uL (ref 4.0–10.5)

## 2017-03-23 LAB — URINALYSIS, ROUTINE W REFLEX MICROSCOPIC
Bilirubin Urine: NEGATIVE
Glucose, UA: NEGATIVE mg/dL
Hgb urine dipstick: NEGATIVE
KETONES UR: NEGATIVE mg/dL
LEUKOCYTES UA: NEGATIVE
NITRITE: NEGATIVE
PROTEIN: NEGATIVE mg/dL
Specific Gravity, Urine: 1.015 (ref 1.005–1.030)
pH: 7 (ref 5.0–8.0)

## 2017-03-23 LAB — BASIC METABOLIC PANEL
Anion gap: 8 (ref 5–15)
BUN: 24 mg/dL — ABNORMAL HIGH (ref 6–20)
CO2: 28 mmol/L (ref 22–32)
Calcium: 9 mg/dL (ref 8.9–10.3)
Chloride: 103 mmol/L (ref 101–111)
Creatinine, Ser: 1.03 mg/dL — ABNORMAL HIGH (ref 0.44–1.00)
GFR calc non Af Amer: 47 mL/min — ABNORMAL LOW (ref >60)
GFR, EST AFRICAN AMERICAN: 54 mL/min — AB (ref >60)
Glucose, Bld: 105 mg/dL — ABNORMAL HIGH (ref 65–99)
POTASSIUM: 3.1 mmol/L — AB (ref 3.5–5.1)
SODIUM: 139 mmol/L (ref 135–145)

## 2017-03-23 LAB — PROTIME-INR
INR: 1.19
PROTHROMBIN TIME: 15 s (ref 11.4–15.2)

## 2017-03-23 LAB — BRAIN NATRIURETIC PEPTIDE: B NATRIURETIC PEPTIDE 5: 957 pg/mL — AB (ref 0.0–100.0)

## 2017-03-23 MED ORDER — POTASSIUM CHLORIDE CRYS ER 20 MEQ PO TBCR
40.0000 meq | EXTENDED_RELEASE_TABLET | Freq: Once | ORAL | Status: AC
  Administered 2017-03-23: 40 meq via ORAL
  Filled 2017-03-23: qty 2

## 2017-03-23 NOTE — ED Provider Notes (Signed)
[facility]-EMERGENCY DEPT Provider Note   CSN: [id number] Arrival date & time: 03/23/17  1649     History   Chief Complaint Chief Complaint  Patient presents with  . Facial Injury  . Wrist Injury    HPI Rhonda Moore is a 83 y.o. female.  The history is provided by the patient, a relative and medical records.  Fall  This is a new problem. The current episode started 2 days ago. The problem occurs constantly. The problem has not changed since onset.Associated symptoms include headaches. Pertinent negatives include no chest pain, no abdominal pain and no shortness of breath. Nothing aggravates the symptoms. Nothing relieves the symptoms. She has tried nothing for the symptoms. The treatment provided no relief.  Wrist Pain  This is a new problem. The current episode started 2 days ago. The problem occurs constantly. The problem has not changed since onset.Associated symptoms include headaches. Pertinent negatives include no chest pain, no abdominal pain and no shortness of breath. The symptoms are aggravated by twisting. Nothing relieves the symptoms. She has tried nothing for the symptoms.    Past Medical History:  Diagnosis Date  . Anemia   . Bronchitis   . Cardiomegaly   . Hyperlipidemia   . Hypertension   . Left carotid bruit   . Palpitation   . Shingles 2007  . Thrombocytopenia (HCC)   . Vitamin D deficiency     Patient Active Problem List   Diagnosis Date Noted  . Non-allergic rhinitis 11/02/2011  . HYPERLIPIDEMIA 11/07/2009  . HYPERTENSION 11/07/2009  . TOBACCO USE, QUIT 11/07/2009  . COPD with chronic bronchitis (HCC) 11/04/2009    Past Surgical History:  Procedure Laterality Date  . ABDOMINAL HYSTERECTOMY    . CATARACT EXTRACTION    . CERVICAL SPINE SURGERY     x 2  . CHOLECYSTECTOMY    . DENTAL SURGERY    . EXTERNAL EAR SURGERY    . JOINT REPLACEMENT    . left hip replacement    . LUMBAR SPINE SURGERY     x 2  . SHOULDER  SURGERY      OB History    No data available       Home Medications    Prior to Admission medications   Medication Sig Start Date End Date Taking? Authorizing Provider  amLODipine (NORVASC) 5 MG tablet Take 1 tablet by mouth daily. 05/14/12   [provider]  aspirin 81 MG tablet Take 81 mg by mouth daily.      [provider]  atenolol (TENORMIN) 50 MG tablet Take 50 mg by mouth 2 (two) times daily.     [provider]  budesonide-formoterol (SYMBICORT) 160-4.5 MCG/ACT inhaler Inhale 2 puffs into the lungs 2 (two) times daily. Rinse mouth Patient taking differently: Inhale 2 puffs into the lungs 2 (two) times daily as needed. Rinse mouth 12/21/13 04/26/16  Jetty DuhamelYoung, Clinton D, MD  Cholecalciferol (VITAMIN D-400 PO) Take 1 tablet by mouth 2 (two) times daily.    [provider]  citalopram (CELEXA) 20 MG tablet Take 1 tablet by mouth daily. 03/11/14   [provider]  fluticasone (FLONASE) 50 MCG/ACT nasal spray Place 1 spray into both nostrils daily as needed for allergies or rhinitis.    [provider]  ibuprofen (ADVIL,MOTRIN) 200 MG tablet Take 400 mg by mouth every 6 (six) hours as needed for moderate pain.    [provider]  ipratropium (ATROVENT) 0.06 % nasal spray  1-2 sprays each nostril, up to 4 times daily as needed Patient taking differently: Place 1 spray into both nostrils 2 (two) times daily as needed for rhinitis.  10/25/11 04/18/16  Waymon Budge, MD  levofloxacin (LEVAQUIN) 750 MG tablet Take 1 tablet (750 mg total) by mouth daily. Patient not taking: Reported on 04/26/2016 04/18/16   Demetrios Loll T, PA-C  levothyroxine (SYNTHROID, LEVOTHROID) 50 MCG tablet Take 1 tablet by mouth daily. 03/22/14   [provider]  losartan (COZAAR) 50 MG tablet Take 1 tablet by mouth daily. 05/26/12   [provider]  Multiple Vitamins-Minerals (ELDERTONIC PO) Take 1 tablet by mouth daily.    [provider]  traMADol (ULTRAM) 50 MG tablet Take 50 mg by mouth 3 (three) times daily as needed for moderate pain.  04/24/12   [provider]    Family History History reviewed. No pertinent family history.  Social History Social History   Tobacco Use  . Smoking status: Former Smoker    Types: Cigarettes    Last attempt to quit: 05/07/1948    Years since quitting: 68.9  . Smokeless tobacco: Current User    Types: Snuff  Substance Use Topics  . Alcohol use: No  . Drug use: No     Allergies   Hydrocodone and Penicillins   Review of Systems Review of Systems  Constitutional: Negative for chills, diaphoresis, fatigue and fever.  HENT: Negative for congestion.   Eyes: Negative for visual disturbance.  Respiratory: Positive for cough (overnight). Negative for chest tightness, shortness of breath and wheezing.   Cardiovascular: Negative for chest pain, palpitations and leg swelling (chronic bilateral).  Gastrointestinal: Negative for abdominal pain, constipation, diarrhea, nausea and vomiting.  Genitourinary: Negative for dysuria and flank pain.  Musculoskeletal: Negative for back pain, neck pain and neck stiffness.  Neurological: Positive for headaches. Negative for syncope, light-headedness and numbness.  Psychiatric/Behavioral: Negative for agitation.  All other systems reviewed and are negative.    Physical Exam Updated Vital Signs BP 125/73 (BP Location: Left Arm)   Pulse 98   Temp 98.8 F (37.1 C) (Oral)   Resp 18   SpO2 100%   Physical Exam  Constitutional: She appears well-developed and well-nourished. No distress.  HENT:  Head: Head is with contusion. Head is without Battle's sign and without laceration.    Nose: No nasal septal hematoma. No epistaxis.  No foreign bodies.  Mouth/Throat: Oropharynx is clear and moist. No oropharyngeal exudate.  Eyes: Conjunctivae and EOM are normal. Pupils are equal, round, and reactive to light.  Neck: Normal  range of motion. Neck supple.  Cardiovascular: Normal rate and intact distal pulses.  No murmur heard. Pulmonary/Chest: No stridor. No respiratory distress. She has rales. She exhibits no tenderness.  Abdominal: Soft. She exhibits no distension. There is no tenderness.  Musculoskeletal: She exhibits edema and tenderness.       Left wrist: She exhibits decreased range of motion, tenderness, bony tenderness, swelling and deformity. She exhibits no laceration.       Left hand: She exhibits tenderness and swelling. She exhibits normal capillary refill. Normal sensation noted.       Hands:      Right lower leg: She exhibits edema.       Left lower leg: She exhibits edema.  Pain and swelling in left wrist and distal forearm patient has ecchymosis and bruising in left distal forearm.  Patient has swelling in the left hand but has no snuffbox tenderness.  Normal sensation and capillary refill in left hand.  Patient has normal pulses in left forearm.  Patient had decreased grip strength due to pain and decreased range of motion of the wrist.  No laceration seen.  Lymphadenopathy:    She has cervical adenopathy.  Neurological: No sensory deficit. She exhibits normal muscle tone.  Skin: Capillary refill takes less than 2 seconds. No rash noted. She is not diaphoretic. No erythema.  Psychiatric: She has a normal mood and affect.  Nursing note and vitals reviewed.    ED Treatments / Results  Labs (all labs ordered are listed, but only abnormal results are displayed) Labs Reviewed  CBC WITH DIFFERENTIAL/PLATELET - Abnormal; Notable for the following components:      Result Value   RBC 2.48 (*)    Hemoglobin 8.1 (*)    HCT 24.4 (*)    Platelets 124 (*)    Monocytes Absolute 1.5 (*)    All other components within normal limits  BASIC METABOLIC PANEL - Abnormal; Notable for the following components:   Potassium 3.1 (*)    Glucose, Bld 105 (*)    BUN 24 (*)    Creatinine, Ser 1.03 (*)    GFR calc  non Af Amer 47 (*)    GFR calc Af Amer 54 (*)    All other components within normal limits  BRAIN NATRIURETIC PEPTIDE - Abnormal; Notable for the following components:   B Natriuretic Peptide 957.0 (*)    All other components within normal limits  URINE CULTURE  PROTIME-INR  URINALYSIS, ROUTINE W REFLEX MICROSCOPIC    EKG  EKG Interpretation  Date/Time:  Saturday March 23 2017 19:02:32 EST Ventricular Rate:  78 PR Interval:    QRS Duration: 92 QT Interval:  387 QTC Calculation: 441 R Axis:   -12 Text Interpretation:  Atrial fibrillation Anterior infarct, old When compared to prior, no significant changes seen.  No STEMI Confirmed by Theda Belfastegeler, Chris ([id number]) on 03/23/2017 9:20:09 PM       Radiology Dg Chest 2 View  Result Date: 03/23/2017 CLINICAL DATA:  Patient fell 2 days ago.  Wrist deformity.  Cough. EXAM: CHEST  2 VIEW COMPARISON:  Chest x-ray April 26, 2016 and scout view from a chest and T July 18, 2016 FINDINGS: No pneumothorax. There is a right-sided pleural effusion. Obscuration of the right heart border is similar since July 18, 2016, likely due to layering effusion and atelectasis. Infiltrate considered less likely. Probable small left effusion with atelectasis. No other acute abnormalities or changes. IMPRESSION: Small layering effusions with underlying opacities, likely atelectasis. Recommend follow-up to resolution. Electronically Signed   By: Gerome Samavid  Williams III M.D   On: 03/23/2017 18:27   Dg Forearm Left  Result Date: 03/23/2017 CLINICAL DATA:  Pain after trauma EXAM: LEFT FOREARM - 2 VIEW COMPARISON:  None. FINDINGS: There is an impacted angulated comminuted fracture of the distal radius. There is a fracture through the ulnar styloid. No obvious carpal bone fractures. Recommend attention to the carpal bones on the dedicated wrist films from today. IMPRESSION: Impacted comminuted fracture of the distal radius. Ulnar styloid fracture. Recommend evaluation of  the carpal bones on the dedicated wrist films already performed. Electronically Signed   By: Gerome Samavid  Williams III M.D   On: 03/23/2017 18:30   Dg Wrist Complete Left  Result Date: 03/23/2017 CLINICAL DATA:  83 year old female with fall and left wrist pain. EXAM: LEFT HAND - COMPLETE 3+ VIEW; LEFT WRIST - COMPLETE 3+ VIEW COMPARISON:  None. FINDINGS: There is a comminuted appearing fracture of the distal radius with a transverse component through the distal radial metaphysis and a vertical component extending to the articular surface. There is mild dorsal angulation and displacement of the distal fracture fragment. There is an age indeterminate mildly displaced fracture of the ulnar-styloid. No other fracture noted. No dislocation. The bones are osteopenic. There is osteoarthritic changes of the base of the thumb. The soft tissue swelling of the wrist. No radiopaque foreign object. Vascular calcifications noted. IMPRESSION: 1. Comminuted intra-articular fracture of the distal radius with mild dorsal angulation of the distal fracture fragment. Age indeterminate mildly displaced fracture of the radial styloid. 2. Advanced osteopenia. 3. Osteoarthritic changes of the base of the thumb. Electronically Signed   By: Elgie Collard M.D.   On: 03/23/2017 18:29   Ct Head Wo Contrast  Result Date: 03/23/2017 CLINICAL DATA:  Fall 2 days ago, facial injuries. Headache and neck pain. EXAM: CT HEAD WITHOUT CONTRAST CT MAXILLOFACIAL WITHOUT CONTRAST CT CERVICAL SPINE WITHOUT CONTRAST TECHNIQUE: Multidetector CT imaging of the head, cervical spine, and maxillofacial structures were performed using the standard protocol without intravenous contrast. Multiplanar CT image reconstructions of the cervical spine and maxillofacial structures were also generated. COMPARISON:  Head CT dated 12/06/2015 brain MRI dated 09/12/2016. FINDINGS: CT HEAD FINDINGS Brain: Chronic small vessel ischemic changes again noted throughout the  bilateral periventricular and subcortical white matter regions. Ventricles are stable in size and configuration. There is no mass, hemorrhage, edema or other evidence of acute parenchymal abnormality. No extra-axial hemorrhage. Vascular: There are chronic calcified atherosclerotic changes of the large vessels at the skull base. No unexpected hyperdense vessel. Skull: Normal. Negative for fracture or focal lesion. Other: None. CT MAXILLOFACIAL FINDINGS Osseous: Lower frontal bones are intact. No displaced nasal bone fracture seen. Osseous structures about the orbits are intact and normally aligned bilaterally. Walls of the maxillary sinuses appear intact bilaterally. Bilateral zygoma and pterygoid plates are intact. No mandible fracture or displacement seen. Orbits: Negative. No traumatic or inflammatory finding. Sinuses: No acute findings Soft tissues: No soft tissue hematoma. CT CERVICAL SPINE FINDINGS Alignment: Anterolisthesis of C3 on C4 is related to underlying degenerative change. Straightening of the lower cervical spine lordosis is related to anterior cervical fusion hardware at the C6-C7 level, with associated ankylosis. No evidence of acute vertebral body subluxation. Skull base and vertebrae: No fracture line or displaced fracture fragment identified. Facet joints appear appropriately aligned. Soft tissues and spinal canal: No prevertebral fluid or swelling. No visible canal hematoma. Disc levels: Extensive degenerative changes at the C4-5 through C7-T1 levels, with associated disc space narrowings and osseous spurring, causing moderate central canal stenoses. Milder degenerative change in the upper cervical spine. Additional degenerative hypertrophy of the facets and uncovertebral joints are causing moderate to severe neural foramen stenoses at C3-4 through C7-T1 with possible associated nerve root impingements. Upper chest: No acute findings. Other: Carotid atherosclerosis. IMPRESSION: 1. No acute  intracranial abnormality. No intracranial mass, hemorrhage or edema. No skull fracture. Chronic small vessel ischemic changes in the white matter. 2. No facial bone fracture or dislocation seen. 3. No fracture or acute subluxation identified within the cervical spine. Extensive degenerative changes within the cervical spine, as detailed above. Anterior cervical fusion hardware within the lower cervical spine appears intact and appropriately positioned. Electronically Signed   By: Bary Richard M.D.   On: 03/23/2017 18:26   Ct Cervical Spine Wo Contrast  Result Date: 03/23/2017 CLINICAL DATA:  Fall 2  days ago, facial injuries. Headache and neck pain. EXAM: CT HEAD WITHOUT CONTRAST CT MAXILLOFACIAL WITHOUT CONTRAST CT CERVICAL SPINE WITHOUT CONTRAST TECHNIQUE: Multidetector CT imaging of the head, cervical spine, and maxillofacial structures were performed using the standard protocol without intravenous contrast. Multiplanar CT image reconstructions of the cervical spine and maxillofacial structures were also generated. COMPARISON:  Head CT dated 12/06/2015 brain MRI dated 09/12/2016. FINDINGS: CT HEAD FINDINGS Brain: Chronic small vessel ischemic changes again noted throughout the bilateral periventricular and subcortical white matter regions. Ventricles are stable in size and configuration. There is no mass, hemorrhage, edema or other evidence of acute parenchymal abnormality. No extra-axial hemorrhage. Vascular: There are chronic calcified atherosclerotic changes of the large vessels at the skull base. No unexpected hyperdense vessel. Skull: Normal. Negative for fracture or focal lesion. Other: None. CT MAXILLOFACIAL FINDINGS Osseous: Lower frontal bones are intact. No displaced nasal bone fracture seen. Osseous structures about the orbits are intact and normally aligned bilaterally. Walls of the maxillary sinuses appear intact bilaterally. Bilateral zygoma and pterygoid plates are intact. No mandible fracture  or displacement seen. Orbits: Negative. No traumatic or inflammatory finding. Sinuses: No acute findings Soft tissues: No soft tissue hematoma. CT CERVICAL SPINE FINDINGS Alignment: Anterolisthesis of C3 on C4 is related to underlying degenerative change. Straightening of the lower cervical spine lordosis is related to anterior cervical fusion hardware at the C6-C7 level, with associated ankylosis. No evidence of acute vertebral body subluxation. Skull base and vertebrae: No fracture line or displaced fracture fragment identified. Facet joints appear appropriately aligned. Soft tissues and spinal canal: No prevertebral fluid or swelling. No visible canal hematoma. Disc levels: Extensive degenerative changes at the C4-5 through C7-T1 levels, with associated disc space narrowings and osseous spurring, causing moderate central canal stenoses. Milder degenerative change in the upper cervical spine. Additional degenerative hypertrophy of the facets and uncovertebral joints are causing moderate to severe neural foramen stenoses at C3-4 through C7-T1 with possible associated nerve root impingements. Upper chest: No acute findings. Other: Carotid atherosclerosis. IMPRESSION: 1. No acute intracranial abnormality. No intracranial mass, hemorrhage or edema. No skull fracture. Chronic small vessel ischemic changes in the white matter. 2. No facial bone fracture or dislocation seen. 3. No fracture or acute subluxation identified within the cervical spine. Extensive degenerative changes within the cervical spine, as detailed above. Anterior cervical fusion hardware within the lower cervical spine appears intact and appropriately positioned. Electronically Signed   By: Bary Richard M.D.   On: 03/23/2017 18:26   Dg Hand Complete Left  Result Date: 03/23/2017 CLINICAL DATA:  83 year old female with fall and left wrist pain. EXAM: LEFT HAND - COMPLETE 3+ VIEW; LEFT WRIST - COMPLETE 3+ VIEW COMPARISON:  None. FINDINGS: There is  a comminuted appearing fracture of the distal radius with a transverse component through the distal radial metaphysis and a vertical component extending to the articular surface. There is mild dorsal angulation and displacement of the distal fracture fragment. There is an age indeterminate mildly displaced fracture of the ulnar-styloid. No other fracture noted. No dislocation. The bones are osteopenic. There is osteoarthritic changes of the base of the thumb. The soft tissue swelling of the wrist. No radiopaque foreign object. Vascular calcifications noted. IMPRESSION: 1. Comminuted intra-articular fracture of the distal radius with mild dorsal angulation of the distal fracture fragment. Age indeterminate mildly displaced fracture of the radial styloid. 2. Advanced osteopenia. 3. Osteoarthritic changes of the base of the thumb. Electronically Signed   By: Ceasar Mons.D.  On: 03/23/2017 18:29   Ct Maxillofacial Wo Contrast  Result Date: 03/23/2017 CLINICAL DATA:  Fall 2 days ago, facial injuries. Headache and neck pain. EXAM: CT HEAD WITHOUT CONTRAST CT MAXILLOFACIAL WITHOUT CONTRAST CT CERVICAL SPINE WITHOUT CONTRAST TECHNIQUE: Multidetector CT imaging of the head, cervical spine, and maxillofacial structures were performed using the standard protocol without intravenous contrast. Multiplanar CT image reconstructions of the cervical spine and maxillofacial structures were also generated. COMPARISON:  Head CT dated 12/06/2015 brain MRI dated 09/12/2016. FINDINGS: CT HEAD FINDINGS Brain: Chronic small vessel ischemic changes again noted throughout the bilateral periventricular and subcortical white matter regions. Ventricles are stable in size and configuration. There is no mass, hemorrhage, edema or other evidence of acute parenchymal abnormality. No extra-axial hemorrhage. Vascular: There are chronic calcified atherosclerotic changes of the large vessels at the skull base. No unexpected hyperdense  vessel. Skull: Normal. Negative for fracture or focal lesion. Other: None. CT MAXILLOFACIAL FINDINGS Osseous: Lower frontal bones are intact. No displaced nasal bone fracture seen. Osseous structures about the orbits are intact and normally aligned bilaterally. Walls of the maxillary sinuses appear intact bilaterally. Bilateral zygoma and pterygoid plates are intact. No mandible fracture or displacement seen. Orbits: Negative. No traumatic or inflammatory finding. Sinuses: No acute findings Soft tissues: No soft tissue hematoma. CT CERVICAL SPINE FINDINGS Alignment: Anterolisthesis of C3 on C4 is related to underlying degenerative change. Straightening of the lower cervical spine lordosis is related to anterior cervical fusion hardware at the C6-C7 level, with associated ankylosis. No evidence of acute vertebral body subluxation. Skull base and vertebrae: No fracture line or displaced fracture fragment identified. Facet joints appear appropriately aligned. Soft tissues and spinal canal: No prevertebral fluid or swelling. No visible canal hematoma. Disc levels: Extensive degenerative changes at the C4-5 through C7-T1 levels, with associated disc space narrowings and osseous spurring, causing moderate central canal stenoses. Milder degenerative change in the upper cervical spine. Additional degenerative hypertrophy of the facets and uncovertebral joints are causing moderate to severe neural foramen stenoses at C3-4 through C7-T1 with possible associated nerve root impingements. Upper chest: No acute findings. Other: Carotid atherosclerosis. IMPRESSION: 1. No acute intracranial abnormality. No intracranial mass, hemorrhage or edema. No skull fracture. Chronic small vessel ischemic changes in the white matter. 2. No facial bone fracture or dislocation seen. 3. No fracture or acute subluxation identified within the cervical spine. Extensive degenerative changes within the cervical spine, as detailed above. Anterior  cervical fusion hardware within the lower cervical spine appears intact and appropriately positioned. Electronically Signed   By: Bary Richard M.D.   On: 03/23/2017 18:26    Procedures Procedures (including critical care time)  Medications Ordered in ED Medications  potassium chloride SA (K-DUR,KLOR-CON) CR tablet 40 mEq (40 mEq Oral Given 03/23/17 2042)     Initial Impression / Assessment and Plan / ED Course  I have reviewed the triage vital signs and the nursing notes.  Pertinent labs & imaging results that were available during my care of the patient were reviewed by me and considered in my medical decision making (see chart for details).     ISLAM VILLESCAS is a 83 y.o. female with a past medical history significant for hypertension, hyperlipidemia, COPD, atrial fibrillation not on anticoagulant, and prior cervical spine surgery who presents with a fall with headaches, facial pain, and left wrist pain.  Patient says that she was trying to get to the bathroom when the power was out 2 days ago when she had  a mechanical fall falling in the bathroom.  She thinks she hit her head on the bathtub but did not lose consciousness.  Patient was found on the ground by family several minutes later as she was trying to crawl out of the bathroom.  She reports pain in her left wrist and left distal arm.  She is right-handed.  She reports intermittent headaches since the fall but denies any vision changes, nausea, or vomiting.  Son reports that patient has had a cough since last night.  Patient denies any fevers, chills, vision changes, constipation, diarrhea, dysuria.  She denies any difficulty breathing or swallowing.  She denies any severe neck pain but reports both anterior and posterior headache.  On exam, patient has bruising and ecchymosis in the anterior face.  Patient has normal extraocular movements.  Patient had normal pupil exam.  Patient's nose showed no evidence of nasal septal hematoma.  No  oropharyngeal abnormality on exam.  Patient did not have neck tenderness.  Patient had some cervical adenopathy was palpated.  Patient's chest was nontender lungs had crackles in the bases.  Patient's abdomen was nontender.  Back nontender.  Patient's legs had mild edema in both lower extremities.  No unilateral tenderness.  Patient's left wrist was swollen and tender.  Patient had range of motion limited in the wrist due to pain.  Patient had swelling of the hand but had no snuffbox tenderness.  Normal sensation in the fingers.  No capillary refill.  Normal pulses.  No laceration seen.  Patient had bruising extending up her left forearm halfway to the elbow.  Patient will have imaging to look for traumatic injuries in the head, face, neck, and left upper extremity.  Patient will have screening laboratory testing in the event the patient needs to go to surgery.  Due to the swelling in the legs and crackles in the lungs, a BNP will be added to blood work.  Chest x-ray will also be obtained due to the cough and crackles heard on exam.  Anticipate reassessment after workup.  Patient did not want pain medications initially.     8:12 PM Diagnostic imaging results are seen above.  Patient found to have no facial, intracranial, or cervical spine injuries.  No fractures in the face.  Left arm x-rays revealed a distal radius and ulna fracture.  Pleural effusion seen on chest x-ray.  No pneumonia.  Dr. Janee Morn with orthopedic hand surgery was called who recommended application of a sugar tong splint and patient will be called for follow-up in clinic.  Patient's laboratory testing revealed hypokalemia.  Oral potassium supplement patient ordered.  BNP was elevated from prior however patient was not hypoxic, not tachycardic, and no respiratory symptoms.  Patient can follow-up with PCP for this and her peripheral edema.  Patient also had anemia worse than prior but denies any bleeding.  Anticipate discharge after  splint application.  Splint applied and patient could wiggle her fingers without difficulty.  Normal sensation in fingers.  Normal capillary refill.  Patient will follow up with hand surgery and her PCP for the other abnormalities..  Patient understood return precautions and patient was discharged in good condition.  Final Clinical Impressions(s) / ED Diagnoses   Final diagnoses:  Fall, initial encounter  Facial injury, initial encounter  Other closed intra-articular fracture of distal end of left radius, initial encounter  Closed fracture of distal end of left ulna, unspecified fracture morphology, initial encounter    ED Discharge Orders    None  Clinical Impression: 1. Fall, initial encounter   2. Facial injury, initial encounter   3. Other closed intra-articular fracture of distal end of left radius, initial encounter   4. Closed fracture of distal end of left ulna, unspecified fracture morphology, initial encounter     Disposition: Discharge  Condition: Good  I have discussed the results, Dx and Tx plan with the pt(& family if present). He/she/they expressed understanding and agree(s) with the plan. Discharge instructions discussed at great length. Strict return precautions discussed and pt &/or family have verbalized understanding of the instructions. No further questions at time of discharge.    This SmartLink is deprecated. Use AVSMEDLIST instead to display the medication list for a patient.  Follow Up: Mack Hook, MD [address] [place] [address] [phone]  Follow up Office will call you Monday or Tuesday to make a follow-up appointment to re-check your fracture for early part of the week of 11-26.  [facility] [address] [id number] mc [place] [address] [phone]  If symptoms worsen      Renee Beale, Canary Brim, MD 03/24/17 [phone]

## 2017-03-23 NOTE — ED Notes (Signed)
No respiratory or acute distress noted alert and oriented x 3 visitors at bedside call light in reach. 

## 2017-03-23 NOTE — ED Triage Notes (Signed)
Pt is presented by family who report that she had a fall 2 days ago injuring her left wrist and her visible facial injuries.

## 2017-03-23 NOTE — Discharge Instructions (Addendum)
Discharge Instructions   You have a splint on your arm because the bone is broken. Move your fingers as much as possible, making a full fist and fully opening the fist. Elevate your hand to reduce pain & swelling of the digits.  Ice over the operative site may be helpful to reduce pain & swelling.  DO NOT USE HEAT. Leave the splint in place until you return to our office.  You may shower, but keep the bandage clean & dry.  You may drive a car when you are off of prescription pain medications and can safely control your vehicle with both hands. Our office will call you to arrange follow-up   Please call [phone] during normal business hours or [phone] after hours for any problems.   *Please note that pain medications will not be refilled after hours or on weekends.   Wrist Fracture Treated With Immobilization A wrist fracture is a break or crack in one of the bones of your wrist. Your wrist is made of eight small bones at the palm of your hand (carpal bones) and two long bones that make your forearm (radius and ulna). A broken wrist is often treated by wearing a cast, splint, or sling (immobilization). This holds the broken pieces in place so they can heal. Follow these instructions at home: If you have a splint:  Wear the splint as told by your doctor. Remove it only as told by your doctor.  Loosen the splint if your fingers tingle, get numb, or turn cold and blue.  Do not let your splint get wet if it is not waterproof.  Keep the splint clean. If you have a sling:  Wear it as told by your doctor. Remove it only as told by your doctor. If you have a cast:  Do not stick anything inside the cast to scratch your skin.  Check the skin around the cast every day. Tell your doctor about any concerns. You may put lotion on dry skin around the edges of the cast. Do not put lotion on the skin underneath the cast.  Do not let your cast get wet if it is not waterproof.  Keep the  cast clean. Bathing  Do not take baths, swim, or use a hot tub until your doctor says that you can. Ask your doctor if you can take showers. You may only be allowed to take sponge baths.  If your cast or splint is not waterproof, cover it with a watertight plastic bag while you take a bath or a shower. Do not let the cast or splint get wet.  If you have a sling, remove it for bathing only if your doctor says this is okay. Managing pain, stiffness, and swelling  If directed, put ice on the injured area. ? Put ice in a plastic bag. ? Place a towel between your skin and the bag. ? Leave the ice on for 20 minutes, 2-3 times a day.  Move your fingers often to avoid stiffness and to lessen swelling.  Raise (elevate) the injured area above the level of your heart while you are sitting or lying down. Driving  Do not drive or use heavy machinery while taking prescription pain medicine.  Ask your doctor when it is safe to drive if you have a cast, splint, or sling on your wrist. Activity  Return to your normal activities as told by your doctor. Ask your doctor what activities are safe for you.  Do range-of-motion exercises only as  told by your doctor. General instructions  Do not put pressure on any part of the cast or splint until it is fully hardened. This may take many hours.  Do not use any tobacco products, such as cigarettes, chewing tobacco, and e-cigarettes. Tobacco can delay bone healing. If you need help quitting, ask your doctor.  Take over-the-counter and prescription medicines only as told by your doctor.  Keep all follow-up visits as told by your doctor. This is important. Contact a doctor if:  Your cast, splint, or sling is damaged or loose.  You have any new pain, swelling, or bruising.  Your pain, swelling, and bruising do not get better.  You have a fever.  You have chills. Get help right away if:  Your skin or fingers on your injured arm turn blue or  gray.  Your arm feels cold or gets numb.  You have very bad pain in your injured wrist. This information is not intended to replace advice given to you by your health care provider. Make sure you discuss any questions you have with your health care provider. Document Released: 10/10/2007 Document Revised: 09/29/2015 Document Reviewed: 01/05/2015 Elsevier Interactive Patient Education  [employer]2018 [employer]

## 2017-03-25 LAB — URINE CULTURE

## 2017-07-04 ENCOUNTER — Emergency Department (HOSPITAL_COMMUNITY): Payer: Medicare HMO

## 2017-07-04 ENCOUNTER — Inpatient Hospital Stay (HOSPITAL_COMMUNITY)
Admission: EM | Admit: 2017-07-04 | Discharge: 2017-07-11 | DRG: 871 | Disposition: A | Discharge status: Hospice | Payer: Medicare HMO | Attending: Internal Medicine | Admitting: Internal Medicine

## 2017-07-04 DIAGNOSIS — K862 Cyst of pancreas: Secondary | ICD-10-CM | POA: Diagnosis present

## 2017-07-04 DIAGNOSIS — Z880 Allergy status to penicillin: Secondary | ICD-10-CM | POA: Diagnosis not present

## 2017-07-04 DIAGNOSIS — J189 Pneumonia, unspecified organism: Secondary | ICD-10-CM | POA: Diagnosis not present

## 2017-07-04 DIAGNOSIS — D696 Thrombocytopenia, unspecified: Secondary | ICD-10-CM | POA: Diagnosis present

## 2017-07-04 DIAGNOSIS — I712 Thoracic aortic aneurysm, without rupture: Secondary | ICD-10-CM | POA: Diagnosis present

## 2017-07-04 DIAGNOSIS — E785 Hyperlipidemia, unspecified: Secondary | ICD-10-CM | POA: Diagnosis present

## 2017-07-04 DIAGNOSIS — K7200 Acute and subacute hepatic failure without coma: Secondary | ICD-10-CM | POA: Diagnosis present

## 2017-07-04 DIAGNOSIS — J440 Chronic obstructive pulmonary disease with acute lower respiratory infection: Secondary | ICD-10-CM | POA: Diagnosis present

## 2017-07-04 DIAGNOSIS — R7989 Other specified abnormal findings of blood chemistry: Secondary | ICD-10-CM

## 2017-07-04 DIAGNOSIS — J90 Pleural effusion, not elsewhere classified: Secondary | ICD-10-CM | POA: Diagnosis present

## 2017-07-04 DIAGNOSIS — Z66 Do not resuscitate: Secondary | ICD-10-CM | POA: Diagnosis present

## 2017-07-04 DIAGNOSIS — N183 Chronic kidney disease, stage 3 (moderate): Secondary | ICD-10-CM | POA: Diagnosis present

## 2017-07-04 DIAGNOSIS — Z79899 Other long term (current) drug therapy: Secondary | ICD-10-CM

## 2017-07-04 DIAGNOSIS — R945 Abnormal results of liver function studies: Secondary | ICD-10-CM | POA: Diagnosis not present

## 2017-07-04 DIAGNOSIS — I420 Dilated cardiomyopathy: Secondary | ICD-10-CM | POA: Diagnosis not present

## 2017-07-04 DIAGNOSIS — N289 Disorder of kidney and ureter, unspecified: Secondary | ICD-10-CM

## 2017-07-04 DIAGNOSIS — I129 Hypertensive chronic kidney disease with stage 1 through stage 4 chronic kidney disease, or unspecified chronic kidney disease: Secondary | ICD-10-CM | POA: Diagnosis present

## 2017-07-04 DIAGNOSIS — Z515 Encounter for palliative care: Secondary | ICD-10-CM | POA: Diagnosis not present

## 2017-07-04 DIAGNOSIS — I340 Nonrheumatic mitral (valve) insufficiency: Secondary | ICD-10-CM | POA: Diagnosis not present

## 2017-07-04 DIAGNOSIS — I272 Pulmonary hypertension, unspecified: Secondary | ICD-10-CM | POA: Diagnosis not present

## 2017-07-04 DIAGNOSIS — I4891 Unspecified atrial fibrillation: Secondary | ICD-10-CM

## 2017-07-04 DIAGNOSIS — R296 Repeated falls: Secondary | ICD-10-CM | POA: Diagnosis present

## 2017-07-04 DIAGNOSIS — J181 Lobar pneumonia, unspecified organism: Secondary | ICD-10-CM | POA: Diagnosis not present

## 2017-07-04 DIAGNOSIS — Z9889 Other specified postprocedural states: Secondary | ICD-10-CM

## 2017-07-04 DIAGNOSIS — J9811 Atelectasis: Secondary | ICD-10-CM | POA: Diagnosis present

## 2017-07-04 DIAGNOSIS — K869 Disease of pancreas, unspecified: Secondary | ICD-10-CM | POA: Diagnosis not present

## 2017-07-04 DIAGNOSIS — J9601 Acute respiratory failure with hypoxia: Secondary | ICD-10-CM | POA: Diagnosis not present

## 2017-07-04 DIAGNOSIS — N179 Acute kidney failure, unspecified: Secondary | ICD-10-CM | POA: Diagnosis not present

## 2017-07-04 DIAGNOSIS — E039 Hypothyroidism, unspecified: Secondary | ICD-10-CM | POA: Diagnosis present

## 2017-07-04 DIAGNOSIS — Z720 Tobacco use: Secondary | ICD-10-CM | POA: Diagnosis not present

## 2017-07-04 DIAGNOSIS — K761 Chronic passive congestion of liver: Secondary | ICD-10-CM | POA: Diagnosis present

## 2017-07-04 DIAGNOSIS — R0902 Hypoxemia: Secondary | ICD-10-CM | POA: Diagnosis not present

## 2017-07-04 DIAGNOSIS — I723 Aneurysm of iliac artery: Secondary | ICD-10-CM | POA: Diagnosis present

## 2017-07-04 DIAGNOSIS — D649 Anemia, unspecified: Secondary | ICD-10-CM

## 2017-07-04 DIAGNOSIS — F0390 Unspecified dementia without behavioral disturbance: Secondary | ICD-10-CM | POA: Diagnosis present

## 2017-07-04 DIAGNOSIS — A419 Sepsis, unspecified organism: Principal | ICD-10-CM | POA: Diagnosis present

## 2017-07-04 DIAGNOSIS — G9341 Metabolic encephalopathy: Secondary | ICD-10-CM

## 2017-07-04 DIAGNOSIS — K7689 Other specified diseases of liver: Secondary | ICD-10-CM | POA: Diagnosis not present

## 2017-07-04 DIAGNOSIS — D631 Anemia in chronic kidney disease: Secondary | ICD-10-CM | POA: Diagnosis present

## 2017-07-04 DIAGNOSIS — Z96642 Presence of left artificial hip joint: Secondary | ICD-10-CM | POA: Diagnosis present

## 2017-07-04 DIAGNOSIS — E860 Dehydration: Secondary | ICD-10-CM

## 2017-07-04 DIAGNOSIS — I6523 Occlusion and stenosis of bilateral carotid arteries: Secondary | ICD-10-CM | POA: Diagnosis present

## 2017-07-04 DIAGNOSIS — J9602 Acute respiratory failure with hypercapnia: Secondary | ICD-10-CM | POA: Diagnosis not present

## 2017-07-04 DIAGNOSIS — I083 Combined rheumatic disorders of mitral, aortic and tricuspid valves: Secondary | ICD-10-CM | POA: Diagnosis present

## 2017-07-04 LAB — CBC WITH DIFFERENTIAL/PLATELET
Basophils Absolute: 0 10*3/uL (ref 0.0–0.1)
Basophils Relative: 0 %
Eosinophils Absolute: 0 10*3/uL (ref 0.0–0.7)
Eosinophils Relative: 0 %
HCT: 29.5 % — ABNORMAL LOW (ref 36.0–46.0)
Hemoglobin: 8.8 g/dL — ABNORMAL LOW (ref 12.0–15.0)
LYMPHS ABS: 0.5 10*3/uL — AB (ref 0.7–4.0)
Lymphocytes Relative: 3 %
MCH: 30.1 pg (ref 26.0–34.0)
MCHC: 29.8 g/dL — ABNORMAL LOW (ref 30.0–36.0)
MCV: 101 fL — ABNORMAL HIGH (ref 78.0–100.0)
MONO ABS: 2.6 10*3/uL — AB (ref 0.1–1.0)
Monocytes Relative: 17 %
Neutro Abs: 12.3 10*3/uL — ABNORMAL HIGH (ref 1.7–7.7)
Neutrophils Relative %: 80 %
PLATELETS: 124 10*3/uL — AB (ref 150–400)
RBC: 2.92 MIL/uL — AB (ref 3.87–5.11)
RDW: 15.9 % — AB (ref 11.5–15.5)
WBC: 15.4 10*3/uL — ABNORMAL HIGH (ref 4.0–10.5)

## 2017-07-04 LAB — URINALYSIS, ROUTINE W REFLEX MICROSCOPIC
BACTERIA UA: NONE SEEN
Bilirubin Urine: NEGATIVE
Glucose, UA: NEGATIVE mg/dL
Hgb urine dipstick: NEGATIVE
KETONES UR: NEGATIVE mg/dL
Leukocytes, UA: NEGATIVE
Nitrite: NEGATIVE
PROTEIN: 100 mg/dL — AB
Specific Gravity, Urine: 1.018 (ref 1.005–1.030)
pH: 5 (ref 5.0–8.0)

## 2017-07-04 LAB — I-STAT VENOUS BLOOD GAS, ED
ACID-BASE EXCESS: 1 mmol/L (ref 0.0–2.0)
Bicarbonate: 27.4 mmol/L (ref 20.0–28.0)
O2 Saturation: 63 %
TCO2: 29 mmol/L (ref 22–32)
pCO2, Ven: 50.3 mmHg (ref 44.0–60.0)
pH, Ven: 7.346 (ref 7.250–7.430)
pO2, Ven: 36 mmHg (ref 32.0–45.0)

## 2017-07-04 LAB — COMPREHENSIVE METABOLIC PANEL
ALBUMIN: 3 g/dL — AB (ref 3.5–5.0)
ALT: 608 U/L — ABNORMAL HIGH (ref 14–54)
ANION GAP: 14 (ref 5–15)
AST: 1193 U/L — ABNORMAL HIGH (ref 15–41)
Alkaline Phosphatase: 67 U/L (ref 38–126)
BILIRUBIN TOTAL: 2.7 mg/dL — AB (ref 0.3–1.2)
BUN: 39 mg/dL — ABNORMAL HIGH (ref 6–20)
CO2: 23 mmol/L (ref 22–32)
Calcium: 9.1 mg/dL (ref 8.9–10.3)
Chloride: 103 mmol/L (ref 101–111)
Creatinine, Ser: 1.29 mg/dL — ABNORMAL HIGH (ref 0.44–1.00)
GFR calc non Af Amer: 36 mL/min — ABNORMAL LOW (ref >60)
GFR, EST AFRICAN AMERICAN: 41 mL/min — AB (ref >60)
Glucose, Bld: 125 mg/dL — ABNORMAL HIGH (ref 65–99)
POTASSIUM: 4.3 mmol/L (ref 3.5–5.1)
SODIUM: 140 mmol/L (ref 135–145)
TOTAL PROTEIN: 6.1 g/dL — AB (ref 6.5–8.1)

## 2017-07-04 LAB — CBG MONITORING, ED: Glucose-Capillary: 140 mg/dL — ABNORMAL HIGH (ref 65–99)

## 2017-07-04 LAB — I-STAT CG4 LACTIC ACID, ED
LACTIC ACID, VENOUS: 4.69 mmol/L — AB (ref 0.5–1.9)
Lactic Acid, Venous: 5.94 mmol/L (ref 0.5–1.9)

## 2017-07-04 LAB — LIPASE, BLOOD: Lipase: 22 U/L (ref 11–51)

## 2017-07-04 MED ORDER — SODIUM CHLORIDE 0.9 % IV BOLUS (SEPSIS)
1000.0000 mL | Freq: Once | INTRAVENOUS | Status: AC
  Administered 2017-07-04: 1000 mL via INTRAVENOUS

## 2017-07-04 MED ORDER — LEVOFLOXACIN IN D5W 750 MG/150ML IV SOLN
750.0000 mg | Freq: Once | INTRAVENOUS | Status: AC
  Administered 2017-07-04: 750 mg via INTRAVENOUS
  Filled 2017-07-04: qty 150

## 2017-07-04 MED ORDER — SODIUM CHLORIDE 0.9 % IV SOLN
INTRAVENOUS | Status: DC
  Administered 2017-07-05 – 2017-07-06 (×2): via INTRAVENOUS

## 2017-07-04 NOTE — ED Notes (Signed)
Pt became more alert in CT scan.  Yelling, thrashing about bed. Pulled out IV.  Pt placed back on bipap and reassured.  Family updated and at bedside.

## 2017-07-04 NOTE — ED Notes (Signed)
Code sepsis activate by Jasmine DecemberSharon, RN Candace aware

## 2017-07-04 NOTE — ED Provider Notes (Signed)
[facility] EMERGENCY DEPARTMENT Provider Note   CSN: [id number] Arrival date & time: 07/04/17  2016     History   Chief Complaint Chief Complaint  Patient presents with  . Altered Mental Status    HPI Rhonda Moore is a 84 y.o. female.  Pt presents to the ED today with altered mental status.  The pt is unable to give any hx.  Per EMS, pt has been unresponsive for 2 days.  She fell on Sunday the 24th, but did not seem to have an injury.  She has had a cough for 2 weeks.  Poor po intake for 1 week with no eating or meds for the last 2 days.  Family told EMS that she's been gagging when they try to administer meds. O2 sats on RA at home were in the 70s.      Past Medical History:  Diagnosis Date  . Anemia   . Bronchitis   . Cardiomegaly   . Hyperlipidemia   . Hypertension   . Left carotid bruit   . Palpitation   . Shingles 2007  . Thrombocytopenia (HCC)   . Vitamin D deficiency     Patient Active Problem List   Diagnosis Date Noted  . Hypoxia 07/04/2017  . Non-allergic rhinitis 11/02/2011  . HYPERLIPIDEMIA 11/07/2009  . HYPERTENSION 11/07/2009  . TOBACCO USE, QUIT 11/07/2009  . COPD with chronic bronchitis (HCC) 11/04/2009    Past Surgical History:  Procedure Laterality Date  . ABDOMINAL HYSTERECTOMY    . CATARACT EXTRACTION    . CERVICAL SPINE SURGERY     x 2  . CHOLECYSTECTOMY    . DENTAL SURGERY    . EXTERNAL EAR SURGERY    . JOINT REPLACEMENT    . left hip replacement    . LUMBAR SPINE SURGERY     x 2  . SHOULDER SURGERY      OB History    No data available       Home Medications    Prior to Admission medications   Medication Sig Start Date End Date Taking? Authorizing Provider  aspirin 81 MG tablet Take 81 mg by mouth daily.     Yes [provider]  Cholecalciferol (VITAMIN D-400 PO) Take 1 tablet by mouth daily.    Yes [provider]  fluticasone (FLONASE) 50 MCG/ACT nasal spray Place 1 spray into  both nostrils daily as needed for allergies or rhinitis.   Yes [provider]  ibuprofen (ADVIL,MOTRIN) 200 MG tablet Take 400 mg by mouth every 6 (six) hours as needed for moderate pain.   Yes [provider]  Multiple Vitamins-Minerals (ELDERTONIC PO) Take 1 tablet by mouth daily.   Yes [provider]  traMADol (ULTRAM) 50 MG tablet Take 50 mg by mouth 3 (three) times daily as needed for moderate pain.  04/24/12  Yes [provider]  amLODipine (NORVASC) 5 MG tablet Take 1 tablet by mouth daily. 05/14/12   [provider]  atenolol (TENORMIN) 50 MG tablet Take 50 mg by mouth daily.     [provider]  budesonide-formoterol (SYMBICORT) 160-4.5 MCG/ACT inhaler Inhale 2 puffs into the lungs 2 (two) times daily. Rinse mouth Patient taking differently: Inhale 2 puffs into the lungs 2 (two) times daily as needed. Rinse mouth 12/21/13 04/26/16  Jetty Duhamel D, MD  citalopram (CELEXA) 20 MG tablet Take 1 tablet by mouth daily. 03/11/14   [provider]  ipratropium (ATROVENT) 0.06 % nasal  spray 1-2 sprays each nostril, up to 4 times daily as needed Patient taking differently: Place 1 spray into both nostrils 2 (two) times daily as needed for rhinitis.  10/25/11 04/18/16  Jetty Duhamel D, MD  levothyroxine (SYNTHROID, LEVOTHROID) 50 MCG tablet Take 1 tablet by mouth daily. 03/22/14   [provider]  losartan (COZAAR) 50 MG tablet Take 1 tablet by mouth daily. 05/26/12   [provider]    Family History No family history on file.  Social History Social History   Tobacco Use  . Smoking status: Former Smoker    Types: Cigarettes    Last attempt to quit: 05/07/1948    Years since quitting: 69.2  . Smokeless tobacco: Current User    Types: Snuff  Substance Use Topics  . Alcohol use: No  . Drug use: No     Allergies   Hydrocodone and Penicillins   Review of Systems Review of Systems  Unable to perform ROS:  Mental status change     Physical Exam Updated Vital Signs BP (!) 127/92   Pulse 83   Temp 99.4 F (37.4 C) (Rectal)   Resp 18   Ht 5\' 6"  (1.676 m)   Wt 52.2 kg (115 lb)   SpO2 96%   BMI 18.56 kg/m   Physical Exam  Constitutional: She appears lethargic. She appears distressed.  HENT:  Head: Normocephalic and atraumatic.  Right Ear: External ear normal.  Left Ear: External ear normal.  Mouth/Throat: Mucous membranes are dry.  Eyes: Conjunctivae and EOM are normal. Pupils are equal, round, and reactive to light.  Neck: Normal range of motion. Neck supple.  Cardiovascular: Normal heart sounds and intact distal pulses. An irregularly irregular rhythm present.  Pulmonary/Chest: She has wheezes.  Abdominal: Soft. Bowel sounds are normal.  Musculoskeletal: Normal range of motion.  Neurological: She appears lethargic.  Pt will open her eyes to verbal questioning.  She appears stronger on the left.  She is following commands.  Skin: Skin is warm. Capillary refill takes less than 2 seconds.  Nursing note and vitals reviewed.    ED Treatments / Results  Labs (all labs ordered are listed, but only abnormal results are displayed) Labs Reviewed  CBC WITH DIFFERENTIAL/PLATELET - Abnormal; Notable for the following components:      Result Value   WBC 15.4 (*)    RBC 2.92 (*)    Hemoglobin 8.8 (*)    HCT 29.5 (*)    MCV 101.0 (*)    MCHC 29.8 (*)    RDW 15.9 (*)    Platelets 124 (*)    Neutro Abs 12.3 (*)    Lymphs Abs 0.5 (*)    Monocytes Absolute 2.6 (*)    All other components within normal limits  URINALYSIS, ROUTINE W REFLEX MICROSCOPIC - Abnormal; Notable for the following components:   Color, Urine AMBER (*)    APPearance HAZY (*)    Protein, ur 100 (*)    Squamous Epithelial / LPF 0-5 (*)    All other components within normal limits  COMPREHENSIVE METABOLIC PANEL - Abnormal; Notable for the following components:   Glucose, Bld 125 (*)    BUN 39 (*)     Creatinine, Ser 1.29 (*)    Total Protein 6.1 (*)    Albumin 3.0 (*)    AST 1,193 (*)    ALT 608 (*)    Total Bilirubin 2.7 (*)    GFR calc non Af Amer 36 (*)  GFR calc Af Amer 41 (*)    All other components within normal limits  CBG MONITORING, ED - Abnormal; Notable for the following components:   Glucose-Capillary 140 (*)    All other components within normal limits  I-STAT CG4 LACTIC ACID, ED - Abnormal; Notable for the following components:   Lactic Acid, Venous 5.94 (*)    All other components within normal limits  I-STAT CG4 LACTIC ACID, ED - Abnormal; Notable for the following components:   Lactic Acid, Venous 4.69 (*)    All other components within normal limits  CULTURE, BLOOD (ROUTINE X 2)  CULTURE, BLOOD (ROUTINE X 2)  URINE CULTURE  LIPASE, BLOOD  I-STAT VENOUS BLOOD GAS, ED    EKG  EKG Interpretation  Date/Time:  Thursday July 04 2017 20:30:18 EST Ventricular Rate:  85 PR Interval:    QRS Duration: 88 QT Interval:  411 QTC Calculation: 489 R Axis:   144 Text Interpretation:  Atrial fibrillation Right axis deviation Borderline low voltage, extremity leads Borderline prolonged QT interval No significant change since last tracing Confirmed by Jacalyn LefevreHaviland, Perel Hauschild [phone]([id number]) on 07/04/2017 8:37:28 PM       Radiology Ct Head Wo Contrast  Result Date: 07/04/2017 CLINICAL DATA:  Altered mental status EXAM: CT HEAD WITHOUT CONTRAST TECHNIQUE: Contiguous axial images were obtained from the base of the skull through the vertex without intravenous contrast. COMPARISON:  03/23/2017 FINDINGS: Brain: No acute territorial infarction, hemorrhage, or intracranial mass is visualized. Moderate atrophy. Fairly extensive small vessel ischemic changes of the white matter. Stable ventricle size. Vascular: No hyperdense vessels. Vertebral artery and carotid vascular calcification. Skull: No fracture. Sinuses/Orbits: Mucosal thickening in the maxillary ethmoid and frontal sinuses.  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 No acute orbital abnormality Other: None IMPRESSION: 1. No definite CT evidence for acute intracranial abnormality. 2. Atrophy and small vessel ischemic changes of the white matter. Electronically Signed   By: Jasmine PangKim  Fujinaga M.D.   On: 07/04/2017 23:06   Dg Chest Port 1 View  Result Date: 07/04/2017 CLINICAL DATA:  Code sepsis, unresponsive 2 days. EXAM: PORTABLE CHEST 1 VIEW COMPARISON:  03/23/2017 FINDINGS: Patient rotated to the right. Evidence of a moderate size right pleural effusion likely with associated basilar atelectasis. Mild prominence of the perihilar markings likely degree of vascular congestion. Stable cardiomegaly. Remainder of the exam is unchanged. IMPRESSION: Worsening moderate size right pleural effusion likely with associated right basilar atelectasis. Stable cardiomegaly with mild vascular congestion. Electronically Signed   By: Elberta Fortisaniel  Boyle M.D.   On: 07/04/2017 21:15    Procedures Procedures (including critical care time)  Medications Ordered in ED Medications  sodium chloride 0.9 % bolus 1,000 mL (0 mLs Intravenous Stopped 07/04/17 2313)    And  sodium chloride 0.9 % bolus 1,000 mL (0 mLs Intravenous Stopped 07/04/17 2313)    And  0.9 %  sodium chloride infusion (not administered)  levofloxacin (LEVAQUIN) IVPB 750 mg (0 mg Intravenous Stopped 07/04/17 2252)     Initial Impression / Assessment and Plan / ED Course  I have reviewed the triage vital signs and the nursing notes.  Pertinent labs & imaging results that were available during my care of the patient were reviewed by me and considered in my medical decision making (see chart for details).  Pt is in afib which she's had in the past according to last EKG, but no diagnosis on Epic.  She is not anticoagulated, but her husband said she has difficulty walking.  CHA2DS2/VAS  Stroke Risk Points :  2 (1 htn, 1 for age)        CRITICAL CARE Performed by: Jacalyn Lefevre   Total critical care time: 30 minutes  Critical care time was exclusive of separately billable procedures and treating other patients.  Critical care was necessary to treat or prevent imminent or life-threatening deterioration.  Critical care was time spent personally by me on the following activities: development of treatment plan with patient and/or surrogate as well as nursing, discussions with consultants, evaluation of patient's response to treatment, examination of patient, obtaining history from patient or surrogate, ordering and performing treatments and interventions, ordering and review of laboratory studies, ordering and review of radiographic studies, pulse oximetry and re-evaluation of patient's condition.   Pt is more responsive with IVFs.  The pt required bipap as O2 sats would not stay up and she was retaining CO2.  Code sepsis was called due to vital signs and lactic acid elevation and possibility of pna/uti.  Pt given sepsis fluids and was given IV levaquin.  Pt d/w Dr. Selena Batten (triad) for admission.  Final Clinical Impressions(s) / ED Diagnoses   Final diagnoses:  Dehydration  Metabolic encephalopathy  Acute respiratory failure with hypoxia and hypercapnia (HCC)  Elevated LFTs  Anemia, unspecified type  Pleural effusion on right  Atrial fibrillation, unspecified type (HCC)  Thrombocytopenia [facility])    ED Discharge Orders    None       Jacalyn Lefevre, MD 07/04/17 2348

## 2017-07-04 NOTE — ED Triage Notes (Signed)
Per EMS pt from home. Family states has been unresponsive x 2 days.  Productive cough x 2 weeks. No meds d/t patient "gagging" when trying to administer.  Pt has had poor po intake x 1 week.  Fall on Sunday but no evaluation per family.

## 2017-07-05 ENCOUNTER — Inpatient Hospital Stay (HOSPITAL_COMMUNITY): Payer: Medicare HMO

## 2017-07-05 ENCOUNTER — Encounter (HOSPITAL_COMMUNITY): Payer: Self-pay | Admitting: Internal Medicine

## 2017-07-05 ENCOUNTER — Other Ambulatory Visit: Payer: Self-pay

## 2017-07-05 DIAGNOSIS — I272 Pulmonary hypertension, unspecified: Secondary | ICD-10-CM

## 2017-07-05 DIAGNOSIS — K869 Disease of pancreas, unspecified: Secondary | ICD-10-CM

## 2017-07-05 DIAGNOSIS — J90 Pleural effusion, not elsewhere classified: Secondary | ICD-10-CM | POA: Diagnosis present

## 2017-07-05 DIAGNOSIS — N289 Disorder of kidney and ureter, unspecified: Secondary | ICD-10-CM

## 2017-07-05 DIAGNOSIS — J9601 Acute respiratory failure with hypoxia: Secondary | ICD-10-CM

## 2017-07-05 DIAGNOSIS — R0902 Hypoxemia: Secondary | ICD-10-CM

## 2017-07-05 DIAGNOSIS — R7989 Other specified abnormal findings of blood chemistry: Secondary | ICD-10-CM | POA: Insufficient documentation

## 2017-07-05 DIAGNOSIS — Z66 Do not resuscitate: Secondary | ICD-10-CM

## 2017-07-05 DIAGNOSIS — N179 Acute kidney failure, unspecified: Secondary | ICD-10-CM

## 2017-07-05 DIAGNOSIS — D696 Thrombocytopenia, unspecified: Secondary | ICD-10-CM

## 2017-07-05 DIAGNOSIS — K7689 Other specified diseases of liver: Secondary | ICD-10-CM

## 2017-07-05 DIAGNOSIS — R945 Abnormal results of liver function studies: Secondary | ICD-10-CM

## 2017-07-05 DIAGNOSIS — I340 Nonrheumatic mitral (valve) insufficiency: Secondary | ICD-10-CM

## 2017-07-05 DIAGNOSIS — I420 Dilated cardiomyopathy: Secondary | ICD-10-CM

## 2017-07-05 DIAGNOSIS — D649 Anemia, unspecified: Secondary | ICD-10-CM | POA: Diagnosis present

## 2017-07-05 DIAGNOSIS — F0390 Unspecified dementia without behavioral disturbance: Secondary | ICD-10-CM

## 2017-07-05 DIAGNOSIS — J9602 Acute respiratory failure with hypercapnia: Secondary | ICD-10-CM

## 2017-07-05 LAB — COMPREHENSIVE METABOLIC PANEL
ALK PHOS: 53 U/L (ref 38–126)
ALT: 627 U/L — AB (ref 14–54)
ANION GAP: 10 (ref 5–15)
AST: 1119 U/L — ABNORMAL HIGH (ref 15–41)
Albumin: 2.7 g/dL — ABNORMAL LOW (ref 3.5–5.0)
BUN: 38 mg/dL — ABNORMAL HIGH (ref 6–20)
CALCIUM: 8.8 mg/dL — AB (ref 8.9–10.3)
CO2: 24 mmol/L (ref 22–32)
CREATININE: 1.06 mg/dL — AB (ref 0.44–1.00)
Chloride: 107 mmol/L (ref 101–111)
GFR, EST AFRICAN AMERICAN: 52 mL/min — AB (ref >60)
GFR, EST NON AFRICAN AMERICAN: 45 mL/min — AB (ref >60)
Glucose, Bld: 134 mg/dL — ABNORMAL HIGH (ref 65–99)
Potassium: 4 mmol/L (ref 3.5–5.1)
Sodium: 141 mmol/L (ref 135–145)
Total Bilirubin: 2 mg/dL — ABNORMAL HIGH (ref 0.3–1.2)
Total Protein: 5.5 g/dL — ABNORMAL LOW (ref 6.5–8.1)

## 2017-07-05 LAB — RESPIRATORY PANEL BY PCR
Adenovirus: NOT DETECTED
BORDETELLA PERTUSSIS-RVPCR: NOT DETECTED
CHLAMYDOPHILA PNEUMONIAE-RVPPCR: NOT DETECTED
Coronavirus 229E: NOT DETECTED
Coronavirus HKU1: NOT DETECTED
Coronavirus NL63: NOT DETECTED
Coronavirus OC43: NOT DETECTED
INFLUENZA A-RVPPCR: NOT DETECTED
INFLUENZA B-RVPPCR: NOT DETECTED
MYCOPLASMA PNEUMONIAE-RVPPCR: NOT DETECTED
Metapneumovirus: NOT DETECTED
Parainfluenza Virus 1: NOT DETECTED
Parainfluenza Virus 2: NOT DETECTED
Parainfluenza Virus 3: NOT DETECTED
Parainfluenza Virus 4: NOT DETECTED
RESPIRATORY SYNCYTIAL VIRUS-RVPPCR: NOT DETECTED
Rhinovirus / Enterovirus: NOT DETECTED

## 2017-07-05 LAB — INFLUENZA PANEL BY PCR (TYPE A & B)
Influenza A By PCR: NEGATIVE
Influenza B By PCR: NEGATIVE

## 2017-07-05 LAB — I-STAT ARTERIAL BLOOD GAS, ED
ACID-BASE EXCESS: 3 mmol/L — AB (ref 0.0–2.0)
Bicarbonate: 29.6 mmol/L — ABNORMAL HIGH (ref 20.0–28.0)
O2 Saturation: 95 %
PO2 ART: 85 mmHg (ref 83.0–108.0)
Patient temperature: 98.7
TCO2: 31 mmol/L (ref 22–32)
pCO2 arterial: 58 mmHg — ABNORMAL HIGH (ref 32.0–48.0)
pH, Arterial: 7.316 — ABNORMAL LOW (ref 7.350–7.450)

## 2017-07-05 LAB — LACTIC ACID, PLASMA
Lactic Acid, Venous: 1.6 mmol/L (ref 0.5–1.9)
Lactic Acid, Venous: 1.6 mmol/L (ref 0.5–1.9)

## 2017-07-05 LAB — TROPONIN I
TROPONIN I: 0.03 ng/mL — AB (ref <0.03)
TROPONIN I: 0.03 ng/mL — AB (ref <0.03)
Troponin I: 0.03 ng/mL (ref <0.03)

## 2017-07-05 LAB — CBC
HCT: 30.8 % — ABNORMAL LOW (ref 36.0–46.0)
Hemoglobin: 9.3 g/dL — ABNORMAL LOW (ref 12.0–15.0)
MCH: 30.3 pg (ref 26.0–34.0)
MCHC: 30.2 g/dL (ref 30.0–36.0)
MCV: 100.3 fL — ABNORMAL HIGH (ref 78.0–100.0)
PLATELETS: 121 10*3/uL — AB (ref 150–400)
RBC: 3.07 MIL/uL — AB (ref 3.87–5.11)
RDW: 15.9 % — ABNORMAL HIGH (ref 11.5–15.5)
WBC: 15.7 10*3/uL — AB (ref 4.0–10.5)

## 2017-07-05 LAB — ECHOCARDIOGRAM COMPLETE
HEIGHTINCHES: 66 in
WEIGHTICAEL: 1840 [oz_av]

## 2017-07-05 LAB — STREP PNEUMONIAE URINARY ANTIGEN: STREP PNEUMO URINARY ANTIGEN: NEGATIVE

## 2017-07-05 MED ORDER — LEVOTHYROXINE SODIUM 50 MCG PO TABS: 50.0000 ug | ORAL_TABLET | Freq: Every day | ORAL | Status: DC

## 2017-07-05 MED ORDER — DIAZEPAM 5 MG/ML IJ SOLN
2.5000 mg | Freq: Once | INTRAMUSCULAR | Status: AC | PRN
  Administered 2017-07-05: 2.5 mg via INTRAVENOUS
  Filled 2017-07-05: qty 2

## 2017-07-05 MED ORDER — TIOTROPIUM BROMIDE MONOHYDRATE 18 MCG IN CAPS: 18.0000 ug | ORAL_CAPSULE | Freq: Every day | RESPIRATORY_TRACT | Status: DC

## 2017-07-05 MED ORDER — ALBUTEROL SULFATE (2.5 MG/3ML) 0.083% IN NEBU: 2.5000 mg | INHALATION_SOLUTION | Freq: Four times a day (QID) | RESPIRATORY_TRACT | Status: DC

## 2017-07-05 MED ORDER — LEVOTHYROXINE SODIUM 50 MCG PO TABS
50.0000 ug | ORAL_TABLET | Freq: Every day | ORAL | Status: DC
  Administered 2017-07-07 – 2017-07-10 (×4): 50 ug via ORAL
  Filled 2017-07-05 (×5): qty 1

## 2017-07-05 MED ORDER — DM-GUAIFENESIN ER 30-600 MG PO TB12
1.0000 | ORAL_TABLET | Freq: Two times a day (BID) | ORAL | Status: DC
  Administered 2017-07-05 – 2017-07-10 (×10): 1 via ORAL
  Filled 2017-07-05 (×10): qty 1

## 2017-07-05 MED ORDER — TRAMADOL HCL 50 MG PO TABS
50.0000 mg | ORAL_TABLET | Freq: Once | ORAL | Status: AC
  Administered 2017-07-05: 50 mg via ORAL
  Filled 2017-07-05: qty 1

## 2017-07-05 MED ORDER — IPRATROPIUM-ALBUTEROL 0.5-2.5 (3) MG/3ML IN SOLN
3.0000 mL | Freq: Four times a day (QID) | RESPIRATORY_TRACT | Status: DC
  Administered 2017-07-05 – 2017-07-07 (×8): 3 mL via RESPIRATORY_TRACT
  Filled 2017-07-05 (×7): qty 3

## 2017-07-05 MED ORDER — LEVOFLOXACIN IN D5W 750 MG/150ML IV SOLN
750.0000 mg | INTRAVENOUS | Status: DC
  Administered 2017-07-06 – 2017-07-08 (×2): 750 mg via INTRAVENOUS
  Filled 2017-07-05 (×2): qty 150

## 2017-07-05 MED ORDER — IOPAMIDOL (ISOVUE-300) INJECTION 61%
INTRAVENOUS | Status: AC
  Administered 2017-07-05: 100 mL
  Filled 2017-07-05: qty 100

## 2017-07-05 MED ORDER — METHYLPREDNISOLONE SODIUM SUCC 125 MG IJ SOLR
80.0000 mg | Freq: Every day | INTRAMUSCULAR | Status: DC
  Administered 2017-07-06: 80 mg via INTRAVENOUS
  Filled 2017-07-05: qty 2

## 2017-07-05 MED ORDER — ALBUTEROL SULFATE (2.5 MG/3ML) 0.083% IN NEBU: 2.5000 mg | INHALATION_SOLUTION | Freq: Four times a day (QID) | RESPIRATORY_TRACT | Status: DC | PRN

## 2017-07-05 MED ORDER — ASPIRIN EC 81 MG PO TBEC
81.0000 mg | DELAYED_RELEASE_TABLET | Freq: Every day | ORAL | Status: DC
  Administered 2017-07-07 – 2017-07-10 (×4): 81 mg via ORAL
  Filled 2017-07-05 (×5): qty 1

## 2017-07-05 MED ORDER — VANCOMYCIN HCL IN DEXTROSE 1-5 GM/200ML-% IV SOLN
1000.0000 mg | Freq: Once | INTRAVENOUS | Status: AC
  Administered 2017-07-05: 1000 mg via INTRAVENOUS
  Filled 2017-07-05: qty 200

## 2017-07-05 MED ORDER — SODIUM CHLORIDE 0.9 % IV SOLN
INTRAVENOUS | Status: AC
  Administered 2017-07-05: 05:00:00 via INTRAVENOUS

## 2017-07-05 MED ORDER — ENOXAPARIN SODIUM 30 MG/0.3ML ~~LOC~~ SOLN
30.0000 mg | SUBCUTANEOUS | Status: DC
  Administered 2017-07-05 – 2017-07-10 (×6): 30 mg via SUBCUTANEOUS
  Filled 2017-07-05 (×7): qty 0.3

## 2017-07-05 MED ORDER — CITALOPRAM HYDROBROMIDE 20 MG PO TABS
20.0000 mg | ORAL_TABLET | Freq: Every day | ORAL | Status: DC
  Administered 2017-07-07 – 2017-07-10 (×4): 20 mg via ORAL
  Filled 2017-07-05 (×5): qty 1

## 2017-07-05 MED ORDER — IPRATROPIUM-ALBUTEROL 0.5-2.5 (3) MG/3ML IN SOLN
RESPIRATORY_TRACT | Status: AC
  Administered 2017-07-05: 3 mL via RESPIRATORY_TRACT
  Filled 2017-07-05: qty 3

## 2017-07-05 MED ORDER — ATENOLOL 25 MG PO TABS
50.0000 mg | ORAL_TABLET | Freq: Every day | ORAL | Status: DC
  Administered 2017-07-07 – 2017-07-08 (×2): 50 mg via ORAL
  Filled 2017-07-05 (×3): qty 2

## 2017-07-05 MED ORDER — METHYLPREDNISOLONE SODIUM SUCC 125 MG IJ SOLR
80.0000 mg | Freq: Three times a day (TID) | INTRAMUSCULAR | Status: DC
  Administered 2017-07-05 (×2): 80 mg via INTRAVENOUS
  Filled 2017-07-05 (×2): qty 2

## 2017-07-05 MED ORDER — VANCOMYCIN HCL 500 MG IV SOLR
500.0000 mg | INTRAVENOUS | Status: DC
  Administered 2017-07-06 – 2017-07-08 (×3): 500 mg via INTRAVENOUS
  Filled 2017-07-05 (×4): qty 500

## 2017-07-05 MED ORDER — LOSARTAN POTASSIUM 50 MG PO TABS: 50.0000 mg | ORAL_TABLET | Freq: Every day | ORAL | Status: DC

## 2017-07-05 NOTE — Progress Notes (Signed)
Pharmacy Antibiotic Note  Rhonda Moore is a 84 y.o. female admitted on 07/04/2017 with altered mental status.  Pharmacy has been consulted for vancomycin and levaquin dosing.  Plan: Vancomycin 1g now then 500mg   IV every 24 hours.  Goal trough 15-20 mcg/mL. Levaquin 750mg  IV q48 hours  Height: 5\' 6"  (167.6 cm) Weight: 115 lb (52.2 kg) IBW/kg (Calculated) : 59.3  Temp (24hrs), Avg:99.4 F (37.4 C), Min:99.4 F (37.4 C), Max:99.4 F (37.4 C)  Recent Labs  Lab 07/04/17 2028 07/04/17 2037 07/04/17 2343  WBC 15.4*  --   --   CREATININE 1.29*  --   --   LATICACIDVEN  --  5.94* 4.69*    Estimated Creatinine Clearance: 24.4 mL/min (A) (by C-G formula based on SCr of 1.29 mg/dL (H)).    Allergies  Allergen Reactions  . Hydrocodone Other (See Comments)    Unknown reaction  . Penicillins Other (See Comments)    Unknown reaction  Has patient had a PCN reaction causing immediate rash, facial/tongue/throat swelling, SOB or lightheadedness with hypotension: unknown Has patient had a PCN reaction causing severe rash involving mucus membranes or skin necrosis: unknown Has patient had a PCN reaction that required hospitalization unknown Has patient had a PCN reaction occurring within the last 10 years: no If all of the above answers are "NO", then may proceed with Cephalosporin use.     Thank you for allowing pharmacy to be a part of this patient's care.  Sheppard CoilFrank Brandun Pinn PharmD., BCPS Clinical Pharmacist 07/05/2017 1:18 AM

## 2017-07-05 NOTE — Progress Notes (Signed)
Pt tolerating HFNC well with SpO2 99-100%, will decrease to 10 LPM, and titrate as indicated.

## 2017-07-05 NOTE — ED Notes (Signed)
Patient wakened easily by voice, moving all extremities, opens eyes, following my commands.

## 2017-07-05 NOTE — Progress Notes (Signed)
Echocardiogram 2D Echocardiogram has been performed.  07/05/2017 4:11 PM Gertie FeyMichelle Tyreka Henneke, BS, RVT, RDCS, RDMS

## 2017-07-05 NOTE — H&P (Addendum)
TRH H&P   Patient Demographics:    Rhonda Moore, is a 84 y.o. female  MRN: [id number]   DOB - 10/23/1925  Admit Date - 07/04/2017  Outpatient Primary MD for the patient is Rozetta Nunnery Cleone Slim., MD  Referring MD/NP/PA:   Isla Pence  Outpatient Specialists:   Patient coming from: home  Chief Complaint  Patient presents with  . Altered Mental Status      HPI:    Rhonda Moore  is a 84 y.o. female, w Thoracic aortic aneurysm, R iliac artery aneurysm,  bilateral carotid stenosis,  Hypertension, hyperlipidemia,  CKD stage3, Anemia, Hypothyroidism,  Copd, Bronchiectasis, pancreatic cystic lesion,  apparently has had cough w yellow green sputum for the past 2 weeks.  Pt has dementia and is unable to provide history.  This is given by her family.  Family states that she was dyspneic today as well and had decrease in responsiveness.  Family states pt has not had fever, chills, cp, palp, n/v, diarrhea, brbpr.   Pt was brought to ED for evaluation.   In ED,  IMPRESSION: Worsening moderate size right pleural effusion likely with associated right basilar atelectasis. Stable cardiomegaly with mild vascular congestion.   CT brain IMPRESSION: 1. No definite CT evidence for acute intracranial abnormality. 2. Atrophy and small vessel ischemic changes of the white matter.  Wbc 15.4, Hgb 8.8, Plt 124 Na 140, K 4.3, Bun 39, Creatinine 1.29 Ast 1,193 Alt 608 Alk phos 67, T. Bili 2.7  La 5.94 Urinalysis negative  Pt will be admitted for AMS, dyspnea, pleural effusion , ? Occult pneumonia. , abnormal liver function .    Review of systems:    In addition to the HPI above,   No Fever-chills, No Headache, No changes with Vision or hearing, No problems swallowing food or Liquids, No Chest pain No Abdominal pain, No Nausea or Vommitting, Bowel movements are regular, No Blood in stool or  Urine, No dysuria, No new skin rashes or bruises, No new joints pains-aches,  No new weakness, tingling, numbness in any extremity, No recent weight gain or loss, No polyuria, polydypsia or polyphagia, No significant Mental Stressors.  A full 10 point Review of Systems was done, except as stated above, all other Review of Systems were negative.   With Past History of the following :    Past Medical History:  Diagnosis Date  . Anemia   . Bronchitis   . Cardiomegaly   . Hyperlipidemia   . Hypertension   . Left carotid bruit   . Palpitation   . Shingles 2007  . Thrombocytopenia ([place])   . Vitamin D deficiency       Past Surgical History:  Procedure Laterality Date  . ABDOMINAL HYSTERECTOMY    . CATARACT EXTRACTION    . CERVICAL SPINE SURGERY     x 2  . CHOLECYSTECTOMY    .  DENTAL SURGERY    . EXTERNAL EAR SURGERY    . JOINT REPLACEMENT    . left hip replacement    . LUMBAR SPINE SURGERY     x 2  . SHOULDER SURGERY        Social History:     Social History   Tobacco Use  . Smoking status: Former Smoker    Types: Cigarettes    Last attempt to quit: 05/07/1948    Years since quitting: 69.2  . Smokeless tobacco: Current User    Types: Snuff  Substance Use Topics  . Alcohol use: No     Lives - at home  Mobility - walks at baseline     Family History :    History reviewed. No pertinent family history. Unable to obtain due to dementia   Home Medications:   Prior to Admission medications   Medication Sig Start Date End Date Taking? Authorizing Provider  aspirin 81 MG tablet Take 81 mg by mouth daily.     Yes [provider]  Cholecalciferol (VITAMIN D-400 PO) Take 1 tablet by mouth daily.    Yes [provider]  fluticasone (FLONASE) 50 MCG/ACT nasal spray Place 1 spray into both nostrils daily as needed for allergies or rhinitis.   Yes [provider]  ibuprofen (ADVIL,MOTRIN) 200 MG tablet Take 400 mg by mouth every 6  (six) hours as needed for moderate pain.   Yes [provider]  Multiple Vitamins-Minerals (ELDERTONIC PO) Take 1 tablet by mouth daily.   Yes [provider]  traMADol (ULTRAM) 50 MG tablet Take 50 mg by mouth 3 (three) times daily as needed for moderate pain.  04/24/12  Yes [provider]  amLODipine (NORVASC) 5 MG tablet Take 1 tablet by mouth daily. 05/14/12   [provider]  atenolol (TENORMIN) 50 MG tablet Take 50 mg by mouth daily.     [provider]  budesonide-formoterol (SYMBICORT) 160-4.5 MCG/ACT inhaler Inhale 2 puffs into the lungs 2 (two) times daily. Rinse mouth Patient taking differently: Inhale 2 puffs into the lungs 2 (two) times daily as needed. Rinse mouth 12/21/13 04/26/16  Baird Lyons D, MD  citalopram (CELEXA) 20 MG tablet Take 1 tablet by mouth daily. 03/11/14   [provider]  ipratropium (ATROVENT) 0.06 % nasal spray 1-2 sprays each nostril, up to 4 times daily as needed Patient taking differently: Place 1 spray into both nostrils 2 (two) times daily as needed for rhinitis.  10/25/11 04/18/16  Baird Lyons D, MD  levothyroxine (SYNTHROID, LEVOTHROID) 50 MCG tablet Take 1 tablet by mouth daily. 03/22/14   [provider]  losartan (COZAAR) 50 MG tablet Take 1 tablet by mouth daily. 05/26/12   [provider]     Allergies:     Allergies  Allergen Reactions  . Hydrocodone Other (See Comments)    Unknown reaction  . Penicillins Other (See Comments)    Unknown reaction  Has patient had a PCN reaction causing immediate rash, facial/tongue/throat swelling, SOB or lightheadedness with hypotension: unknown Has patient had a PCN reaction causing severe rash involving mucus membranes or skin necrosis: unknown Has patient had a PCN reaction that required hospitalization unknown Has patient had a PCN reaction occurring within the last 10 years: no If all of the above answers are "NO", then may proceed  with Cephalosporin use.      Physical Exam:   Vitals  Blood pressure (!) 127/92, pulse 83, temperature 99.4 F (37.4  C), temperature source Rectal, resp. rate 18, height '5\' 6"'  (1.676 m), weight 52.2 kg (115 lb), SpO2 96 %.   1. General  lying in bed in NAD,   2. Normal affect and insight, Not Suicidal or Homicidal, Awake Alert, Oriented X 1.  3. No F.N deficits, ALL C.Nerves Intact, Strength 5/5 all 4 extremities, Sensation intact all 4 extremities, Plantars down going.  4. Ears and Eyes appear Normal, Conjunctivae clear, PERRLA. Moist Oral Mucosa.  5. Supple Neck, No JVD, No cervical lymphadenopathy appriciated, No Carotid Bruits.  6. Symmetrical Chest wall movement, Good air movement bilaterally, decrease bs at right lung base, + slight crackles , no wheezing  7. RRR, S1, S2  8. Positive Bowel Sounds, Abdomen Soft, No tenderness, No organomegaly appriciated,No rebound -guarding or rigidity.  9.  No Cyanosis, Normal Skin Turgor, No Skin Rash or Bruise.  10. Good muscle tone,  joints appear normal , no effusions, Normal ROM.  11. No Palpable Lymph Nodes in Neck or Axillae     Data Review:    CBC Recent Labs  Lab 07/04/17 2028  WBC 15.4*  HGB 8.8*  HCT 29.5*  PLT 124*  MCV 101.0*  MCH 30.1  MCHC 29.8*  RDW 15.9*  LYMPHSABS 0.5*  MONOABS 2.6*  EOSABS 0.0  BASOSABS 0.0   ------------------------------------------------------------------------------------------------------------------  Chemistries  Recent Labs  Lab 07/04/17 2028  NA 140  K 4.3  CL 103  CO2 23  GLUCOSE 125*  BUN 39*  CREATININE 1.29*  CALCIUM 9.1  AST 1,193*  ALT 608*  ALKPHOS 67  BILITOT 2.7*   ------------------------------------------------------------------------------------------------------------------ estimated creatinine clearance is 24.4 mL/min (A) (by C-G formula based on SCr of 1.29 mg/dL  (H)). ------------------------------------------------------------------------------------------------------------------ No results for input(s): TSH, T4TOTAL, T3FREE, THYROIDAB in the last 72 hours.  Invalid input(s): FREET3  Coagulation profile No results for input(s): INR, PROTIME in the last 168 hours. ------------------------------------------------------------------------------------------------------------------- No results for input(s): DDIMER in the last 72 hours. -------------------------------------------------------------------------------------------------------------------  Cardiac Enzymes No results for input(s): CKMB, TROPONINI, MYOGLOBIN in the last 168 hours.  Invalid input(s): CK ------------------------------------------------------------------------------------------------------------------    Component Value Date/Time   BNP 957.0 (H) 03/23/2017 1831     ---------------------------------------------------------------------------------------------------------------  Urinalysis    Component Value Date/Time   COLORURINE AMBER (A) 07/04/2017 2043   APPEARANCEUR HAZY (A) 07/04/2017 2043   LABSPEC 1.018 07/04/2017 2043   PHURINE 5.0 07/04/2017 2043   GLUCOSEU NEGATIVE 07/04/2017 2043   HGBUR NEGATIVE 07/04/2017 2043   [place] NEGATIVE 07/04/2017 2043   [place] NEGATIVE 07/04/2017 2043   PROTEINUR 100 (A) 07/04/2017 2043   NITRITE NEGATIVE 07/04/2017 2043   LEUKOCYTESUR NEGATIVE 07/04/2017 2043    ----------------------------------------------------------------------------------------------------------------   Imaging Results:    Ct Head Wo Contrast  Result Date: 07/04/2017 CLINICAL DATA:  Altered mental status EXAM: CT HEAD WITHOUT CONTRAST TECHNIQUE: Contiguous axial images were obtained from the base of the skull through the vertex without intravenous contrast. COMPARISON:  03/23/2017 FINDINGS: Brain: No acute territorial infarction, hemorrhage,  or intracranial mass is visualized. Moderate atrophy. Fairly extensive small vessel ischemic changes of the white matter. Stable ventricle size. Vascular: No hyperdense vessels. Vertebral artery and carotid vascular calcification. Skull: No fracture. Sinuses/Orbits: Mucosal thickening in the maxillary ethmoid and frontal sinuses. 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 No acute orbital abnormality Other: None IMPRESSION: 1. No definite CT evidence for acute intracranial abnormality. 2. Atrophy and small vessel ischemic changes of the white matter. Electronically Signed   By: Madie Reno.D.  On: 07/04/2017 23:06   Dg Chest Port 1 View  Result Date: 07/04/2017 CLINICAL DATA:  Code sepsis, unresponsive 2 days. EXAM: PORTABLE CHEST 1 VIEW COMPARISON:  03/23/2017 FINDINGS: Patient rotated to the right. Evidence of a moderate size right pleural effusion likely with associated basilar atelectasis. Mild prominence of the perihilar markings likely degree of vascular congestion. Stable cardiomegaly. Remainder of the exam is unchanged. IMPRESSION: Worsening moderate size right pleural effusion likely with associated right basilar atelectasis. Stable cardiomegaly with mild vascular congestion. Electronically Signed   By: Marin Olp M.D.   On: 07/04/2017 21:15       Assessment & Plan:    Principal Problem:   Hypoxia Active Problems:   Renal insufficiency   Anemia   Pleural effusion   Thrombocytopenia (HCC)   Dementia   DNR (do not resuscitate)    Hypoxia Copd vs R pleural effusion vs pneumonia Blood culture x2 Urine strep antigen Urine legionella antigen Influenza  resp viral panel Vanco iv pharmacy to dose, levaquin iv pharmacy to dose Solumedrol 26m iv q8h spiriva 1puff qday Albuterol 1 neb q6h and q6h prn  Pleural effusion (right) doubt [place] CT chest Cardiac echo  Abnormal liver function, h/o pancreatic cystic mass Check acute hepatitis panel CT chest/ abd ,  pelvis  Renal insufficiency Hydrate with ns iv Check cmp in am  Anemia/ Thrombocytopenia Check cbc in am  Dementia Code status DNR per family  Hypothyroidism Cont levothyroxine Check TSH  Carotid stenosis Cont aspirin  Hypertension Hold Amlodipine Cont Losartan Cont Atenolol  DVT Prophylaxis   Lovenox - SCDs  AM Labs Ordered, also please review Full Orders  Family Communication: Admission, patients condition and plan of care including tests being ordered have been discussed with the patient's family   who indicate understanding and agree with the plan and Code Status.  Code Status DNR  Likely DC to  TBD  Condition GUARDED   Consults called: none  Admission status: inpatient   Time spent in minutes : 45   JJani GravelM.D on 07/05/2017 at 12:37 AM  Between 7am to 7pm - Pager - [phone] . After 7pm go to [email or web address] - password [email or web address] - Office  [phone]

## 2017-07-05 NOTE — Progress Notes (Signed)
RT note: RT discontinued Spiriva QD and changed albuterol QD to Duo Q6 because patient was on BIPAP and not able to take DPI and rinse mouth. Per RCP.

## 2017-07-05 NOTE — Progress Notes (Signed)
Pt transported to 4E w/o difficulty.  More responsive, able to answer questions.  Frequent coughing while on NIV, unable to fully clear secretions.  Placed on 12L HFNC with adequate SpO2.  Continues to have productive cough, would benefit from Chest vest CPT (history of bronchiectasis) to help mobilize secretions.  Return to NIV if WOB increased, pt labors or begins to tire.

## 2017-07-05 NOTE — Progress Notes (Signed)
PROGRESS NOTE    Rhonda Moore  BJY:[id number] DOB: 04-11-1926 DOA: 07/04/2017 PCP: [name]., MD   Brief Narrative:  84 y.o. WF PMHx Cardiomegaly,Thoracic aortic aneurysm,RIGHT iliac artery aneurysm,  Bilateral carotid stenosis,HTN, HLD, CKD stage3, Anemia,Thrombocytopenia Hypothyroidism,  COPD, Bronchiectasis, pancreatic cystic lesion.  Apparently has had cough w yellow green sputum for the past 2 weeks.  Pt has dementia and is unable to provide history.  This is given by her family.  Family states that she was dyspneic today as well and had decrease in responsiveness.  Family states pt has not had fever, chills, cp, palp, n/v, diarrhea, brbpr.   Pt was brought to ED for evaluation.    In ED,  IMPRESSION: Worsening moderate size right pleural effusion likely with associated right basilar atelectasis. Stable cardiomegaly with mild vascular congestion.    Subjective: 3/1 A/O 2 (does not know where, when). Does know she is in the hospital. Positive SOB positive, positive productive cough negative CP negative abdominal pain negative N/V.   Assessment & Plan:   Principal Problem:   Hypoxia Active Problems:   Renal insufficiency   Anemia   Pleural effusion   Thrombocytopenia (HCC)   Dementia   DNR (do not resuscitate)   Acute respiratory failure with hypoxia/Sepsis/CAP -Complete seven-day course antibiotics -DuoNeb -Mucinex DM -Solu-Medrol 80 mg daily -Physiotherapy vest BID -Titrate O2 to maintain SPO2> 93%  RIGHT Pleural Effusion  -Infectious vs CHF -IR diagnostic/therapeutic thoracentesis pending  Dilated cardiomyopathy -Strict in and out -Daily weight -Atenolol 50 mg daily -Hold amlodipine, losartan, -Transfuse hemoglobin<8  Pulmonary hypertension -See cardiomyopathy  Carotid stenosis  Essential HTN See cardiomyopathy   Abnormal liver function/Hx Pancreatic cyst mass -Acute hepatitis panel pending -CT chest/abdomen/pelvis: Essentially unchanged see  results below   A KI Recent Labs  Lab 07/04/17 2028 07/05/17 0414  CREATININE 1.29* 1.06*  -Hold ACEI/ARB -Hold nephrotoxic medication  Anemia -Anemia panel pending   Dementia -Unknown baseline  Hypothyroidism -Synthroid 50 g daily      DVT prophylaxis: Lovenox Code Status: DO NOT RESUSCITATE Family Communication: None Disposition Plan: TBD   Consultants:  None    Procedures/Significant Events:  2/28 CT head W contrast:No definite CT evidence for acute intracranial abnormality. 2. Atrophy and small vessel ischemic changes of the white matter. 3/1 CT abdomen pelvis W contrast:Pneumonia on the left, most extensive in the upper lobe. 2. Volume overload with anasarca and pleural effusions. Right pleural effusion is large and cuases right middle and lower lobe collapse. The left pleural effusion is small. 3. Non filling of the left atrial appendage which may be related to contrast timing or thrombus. The appendage did opacify in 2018. Please correlate for atrial fibrillation. 4. Fusiform ascending aortic aneurysm measuring up to 4.8 cm, unchanged from 2018. 5. Multiple cystic pancreatic masses without worrisome growth since 2018. 3/1 CT chest W contrast:-Pneumonia on the left, most extensive in the upper lobe. - Right pleural effusion is large and cuases right middle and lower lobe collapse. The left pleural effusion is small. -Non filling of the left atrial appendage which may be related to contrast timing or thrombus. The appendage did opacify in 2018. Please correlate for atrial fibrillation. - Fusiform ascending aortic aneurysm measuring up to 4.8 cm, unchanged from 2018. - Multiple cystic pancreatic masses without worrisome growth since 2018 3/1 Echocardiogram; LVEF=.50%-55%. Hypokinesis of the anteroseptal and inferoseptal   myocardium. - Aortic valve: moderate regurgitation.  -- Left atrium: severely dilated. - Right atrium: severely dilated. -  Tricuspid valve: moderate-severe regurgitation. - Pulmonary arteries: PA  peak pressure: 72 mm Hg (S).      I have personally reviewed and interpreted all radiology studies and my findings are as above.  VENTILATOR SETTINGS:    Cultures   Antimicrobials: Anti-infectives (From admission, onward)   Start     Stop   07/06/17 2200  levofloxacin (LEVAQUIN) IVPB 750 mg         07/06/17 0600  vancomycin (VANCOCIN) 500 mg in sodium chloride 0.9 % 100 mL IVPB         07/05/17 0130  vancomycin (VANCOCIN) IVPB 1000 mg/200 mL premix     07/05/17 0248   07/04/17 2100  levofloxacin (LEVAQUIN) IVPB 750 mg     07/04/17 2252       Devices    LINES / TUBES:      Continuous Infusions: . sodium chloride Stopped (07/05/17 0030)  . sodium chloride 75 mL/hr at 07/05/17 0513  . [START ON 07/06/2017] levofloxacin (LEVAQUIN) IV    . [START ON 07/06/2017] vancomycin       Objective: Vitals:   07/05/17 0900 07/05/17 0930 07/05/17 1126 07/05/17 1329  BP: (!) 95/56 99/69    Pulse: 85 87    Resp: 17 19    Temp:      TempSrc:      SpO2: 96% 98% 96% 96%  Weight:      Height:       No intake or output data in the 24 hours ending 07/05/17 1355 Filed Weights   07/04/17 2052  Weight: 115 lb (52.2 kg)    Examination:  General: A/O 2 (does not know where, when) positive acute respiratory distress Neck:  Negative scars, masses, torticollis, lymphadenopathy, JVD Lungs: absent breath sounds right lung fields, positive rhonchi left lung fields, negative wheeze. Cardiovascular: irregular irregular rhythm and rate,without murmur gallop or rub normal S1 and S2 Abdomen: negative abdominal pain, nondistended, positive soft, bowel sounds, no rebound, no ascites, no appreciable mass Extremities: No significant cyanosis, clubbing, or edema bilateral lower extremities Skin: Negative rashes, lesions, ulcers Psychiatric:  Unable to fully evaluate secondary to dementia vs AMS  Central nervous  system:  Cranial nerves II through XII intact, tongue/uvula midline, moves all extremities to command,Unable to fully evaluate secondary to dementia vs AMS   .     Data Reviewed: Care during the described time interval was provided by me .  I have reviewed this patient's available data, including medical history, events of note, physical examination, and all test results as part of my evaluation.   CBC: Recent Labs  Lab 07/04/17 2028 07/05/17 0414  WBC 15.4* 15.7*  NEUTROABS 12.3*  --   HGB 8.8* 9.3*  HCT 29.5* 30.8*  MCV 101.0* 100.3*  PLT 124* 121*   Basic Metabolic Panel: Recent Labs  Lab 07/04/17 2028 07/05/17 0414  NA 140 141  K 4.3 4.0  CL 103 107  CO2 23 24  GLUCOSE 125* 134*  BUN 39* 38*  CREATININE 1.29* 1.06*  CALCIUM 9.1 8.8*   GFR: Estimated Creatinine Clearance: 29.6 mL/min (A) (by C-G formula based on SCr of 1.06 mg/dL (H)). Liver Function Tests: Recent Labs  Lab 07/04/17 2028 07/05/17 0414  AST 1,193* 1,119*  ALT 608* 627*  ALKPHOS 67 53  BILITOT 2.7* 2.0*  PROT 6.1* 5.5*  ALBUMIN 3.0* 2.7*   Recent Labs  Lab 07/04/17 2311  LIPASE 22   No results for input(s): AMMONIA in the last 168 hours.  Coagulation Profile: No results for input(s): INR, PROTIME in the last 168 hours. Cardiac Enzymes: Recent Labs  Lab 07/05/17 0414 07/05/17 0813  TROPONINI 0.03* 0.03*   BNP (last 3 results) No results for input(s): PROBNP in the last 8760 hours. HbA1C: No results for input(s): HGBA1C in the last 72 hours. CBG: Recent Labs  Lab 07/04/17 2027  GLUCAP 140*   Lipid Profile: No results for input(s): CHOL, HDL, LDLCALC, TRIG, CHOLHDL, LDLDIRECT in the last 72 hours. Thyroid Function Tests: No results for input(s): TSH, T4TOTAL, FREET4, T3FREE, THYROIDAB in the last 72 hours. Anemia Panel: No results for input(s): VITAMINB12, FOLATE, FERRITIN, TIBC, IRON, RETICCTPCT in the last 72 hours. Urine analysis:    Component Value Date/Time    COLORURINE AMBER (A) 07/04/2017 2043   APPEARANCEUR HAZY (A) 07/04/2017 2043   LABSPEC 1.018 07/04/2017 2043   PHURINE 5.0 07/04/2017 2043   GLUCOSEU NEGATIVE 07/04/2017 2043   HGBUR NEGATIVE 07/04/2017 2043   BILIRUBINUR NEGATIVE 07/04/2017 2043   KETONESUR NEGATIVE 07/04/2017 2043   PROTEINUR 100 (A) 07/04/2017 2043   NITRITE NEGATIVE 07/04/2017 2043   LEUKOCYTESUR NEGATIVE 07/04/2017 2043   Sepsis Labs: @LABRCNTIP (procalcitonin:4,lacticidven:4)  ) Recent Results (from the past 240 hour(s))  Blood Cultures (routine x 2)     Status: None (Preliminary result)   Collection Time: 07/04/17  8:40 PM  Result Value Ref Range Status   Specimen Description BLOOD SITE NOT SPECIFIED  Final   Special Requests IN PEDIATRIC BOTTLE Blood Culture adequate volume  Final   Culture   Final    NO GROWTH < 24 HOURS Performed at [facility], [address], [place], [place] [address]    Report Status PENDING  Incomplete  Blood Cultures (routine x 2)     Status: None (Preliminary result)   Collection Time: 07/04/17  9:15 PM  Result Value Ref Range Status   Specimen Description BLOOD RIGHT ANTECUBITAL  Final   Special Requests   Final    BOTTLES DRAWN AEROBIC AND ANAEROBIC Blood Culture adequate volume   Culture   Final    NO GROWTH < 24 HOURS Performed at [facility], [address] [place], [place] [address]    Report Status PENDING  Incomplete  Respiratory Panel by PCR     Status: None   Collection Time: 07/05/17 10:33 AM  Result Value Ref Range Status   Adenovirus NOT DETECTED NOT DETECTED Final   Coronavirus 229E NOT DETECTED NOT DETECTED Final   Coronavirus HKU1 NOT DETECTED NOT DETECTED Final   Coronavirus NL63 NOT DETECTED NOT DETECTED Final   Coronavirus OC43 NOT DETECTED NOT DETECTED Final   Metapneumovirus NOT DETECTED NOT DETECTED Final   Rhinovirus / Enterovirus NOT DETECTED NOT DETECTED Final   Influenza A NOT DETECTED NOT DETECTED Final   Influenza B NOT  DETECTED NOT DETECTED Final   Parainfluenza Virus 1 NOT DETECTED NOT DETECTED Final   Parainfluenza Virus 2 NOT DETECTED NOT DETECTED Final   Parainfluenza Virus 3 NOT DETECTED NOT DETECTED Final   Parainfluenza Virus 4 NOT DETECTED NOT DETECTED Final   Respiratory Syncytial Virus NOT DETECTED NOT DETECTED Final   Bordetella pertussis NOT DETECTED NOT DETECTED Final   Chlamydophila pneumoniae NOT DETECTED NOT DETECTED Final   Mycoplasma pneumoniae NOT DETECTED NOT DETECTED Final         Radiology Studies: Ct Head Wo Contrast  Result Date: 07/04/2017 CLINICAL DATA:  Altered mental status EXAM: CT HEAD WITHOUT CONTRAST TECHNIQUE: Contiguous axial  images were obtained from the base of the skull through the vertex without intravenous contrast. COMPARISON:  03/23/2017 FINDINGS: Brain: No acute territorial infarction, hemorrhage, or intracranial mass is visualized. Moderate atrophy. Fairly extensive small vessel ischemic changes of the white matter. Stable ventricle size. Vascular: No hyperdense vessels. Vertebral artery and carotid vascular calcification. Skull: No fracture. Sinuses/Orbits: Mucosal thickening in the maxillary ethmoid and frontal sinuses. 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 No acute orbital abnormality Other: None IMPRESSION: 1. No definite CT evidence for acute intracranial abnormality. 2. Atrophy and small vessel ischemic changes of the white matter. Electronically Signed   By: Jasmine Pang M.D.   On: 07/04/2017 23:06   Ct Chest W Contrast  Result Date: 07/05/2017 CLINICAL DATA:  Pleural effusion.  Altered mental status. EXAM: CT CHEST, ABDOMEN, AND PELVIS WITH CONTRAST TECHNIQUE: Multidetector CT imaging of the chest, abdomen and pelvis was performed following the standard protocol during bolus administration of intravenous contrast. CONTRAST:  ISOVUE-300 IOPAMIDOL (ISOVUE-300) INJECTION 61% COMPARISON:  02/04/2017 FINDINGS: CT CHEST FINDINGS Cardiovascular: Chronic  cardiomegaly, especially dilated is the right atrium. There is poor filling of the left atrial appendage. Diffuse atherosclerotic plaque. Known fusiform ascending aortic aneurysm measuring up to 4.8 cm. No acute vascular finding. Mediastinum/Nodes: It negative for adenopathy or mass. Lungs/Pleura: Large right and small left pleural effusions. There is associated atelectasis with complete collapse of the right middle and right lower lobes. There is right upper and left lower atelectasis as well. Patchy airspace opacity in the left lung, greatest in the upper lobe. No generalized Kerley lines. Musculoskeletal: No acute or aggressive finding.  Anasarca CT ABDOMEN PELVIS FINDINGS Hepatobiliary: No focal liver abnormality.Cholecystectomy. Chronic common bile duct dilatation that is presumably physiologic. Pancreas: Chronic simple appearing cystic masses along the pancreas, including an exophytic mass from the head measuring up to 4 cm. No main duct dilatation seen. Generalized pancreatic atrophy. Spleen: Unremarkable. Adrenals/Urinary Tract: Negative adrenals. No hydronephrosis or stone. Unremarkable bladder. Stomach/Bowel: No obstruction. Colonic diverticulosis. No inflammatory changes. Vascular/Lymphatic: Extensive atherosclerotic plaque of the aorta and visceral branches. There is a saccular aneurysm from the right common iliac artery measuring 17 mm in diameter, stable. Mild infrarenal fusiform aneurysmal enlargement. No acute vascular finding. No mass or adenopathy. Reproductive:Hysterectomy. Other: Small volume ascites likely from volume overload. There is generalized retroperitoneal edema. Musculoskeletal: Left hip arthroplasty with attendant artifact. There is advanced disc and facet degeneration with scoliosis and L4-5 anterolisthesis. IMPRESSION: 1. Pneumonia on the left, most extensive in the upper lobe. 2. Volume overload with anasarca and pleural effusions. Right pleural effusion is large and cuases right  middle and lower lobe collapse. The left pleural effusion is small. 3. Non filling of the left atrial appendage which may be related to contrast timing or thrombus. The appendage did opacify in 2018. Please correlate for atrial fibrillation. 4. Fusiform ascending aortic aneurysm measuring up to 4.8 cm, unchanged from 2018. 5. Multiple cystic pancreatic masses without worrisome growth since 2018. Electronically Signed   By: Marnee Spring M.D.   On: 07/05/2017 07:01   Ct Abdomen Pelvis W Contrast  Result Date: 07/05/2017 CLINICAL DATA:  Pleural effusion.  Altered mental status. EXAM: CT CHEST, ABDOMEN, AND PELVIS WITH CONTRAST TECHNIQUE: Multidetector CT imaging of the chest, abdomen and pelvis was performed following the standard protocol during bolus administration of intravenous contrast. CONTRAST:  ISOVUE-300 IOPAMIDOL (ISOVUE-300) INJECTION 61% COMPARISON:  02/04/2017 FINDINGS: CT CHEST FINDINGS Cardiovascular: Chronic cardiomegaly, especially dilated is the right atrium. There  is poor filling of the left atrial appendage. Diffuse atherosclerotic plaque. Known fusiform ascending aortic aneurysm measuring up to 4.8 cm. No acute vascular finding. Mediastinum/Nodes: It negative for adenopathy or mass. Lungs/Pleura: Large right and small left pleural effusions. There is associated atelectasis with complete collapse of the right middle and right lower lobes. There is right upper and left lower atelectasis as well. Patchy airspace opacity in the left lung, greatest in the upper lobe. No generalized Kerley lines. Musculoskeletal: No acute or aggressive finding.  Anasarca CT ABDOMEN PELVIS FINDINGS Hepatobiliary: No focal liver abnormality.Cholecystectomy. Chronic common bile duct dilatation that is presumably physiologic. Pancreas: Chronic simple appearing cystic masses along the pancreas, including an exophytic mass from the head measuring up to 4 cm. No main duct dilatation seen. Generalized pancreatic  atrophy. Spleen: Unremarkable. Adrenals/Urinary Tract: Negative adrenals. No hydronephrosis or stone. Unremarkable bladder. Stomach/Bowel: No obstruction. Colonic diverticulosis. No inflammatory changes. Vascular/Lymphatic: Extensive atherosclerotic plaque of the aorta and visceral branches. There is a saccular aneurysm from the right common iliac artery measuring 17 mm in diameter, stable. Mild infrarenal fusiform aneurysmal enlargement. No acute vascular finding. No mass or adenopathy. Reproductive:Hysterectomy. Other: Small volume ascites likely from volume overload. There is generalized retroperitoneal edema. Musculoskeletal: Left hip arthroplasty with attendant artifact. There is advanced disc and facet degeneration with scoliosis and L4-5 anterolisthesis. IMPRESSION: 1. Pneumonia on the left, most extensive in the upper lobe. 2. Volume overload with anasarca and pleural effusions. Right pleural effusion is large and cuases right middle and lower lobe collapse. The left pleural effusion is small. 3. Non filling of the left atrial appendage which may be related to contrast timing or thrombus. The appendage did opacify in 2018. Please correlate for atrial fibrillation. 4. Fusiform ascending aortic aneurysm measuring up to 4.8 cm, unchanged from 2018. 5. Multiple cystic pancreatic masses without worrisome growth since 2018. Electronically Signed   By: Marnee Spring M.D.   On: 07/05/2017 07:01   Dg Chest Port 1 View  Result Date: 07/04/2017 CLINICAL DATA:  Code sepsis, unresponsive 2 days. EXAM: PORTABLE CHEST 1 VIEW COMPARISON:  03/23/2017 FINDINGS: Patient rotated to the right. Evidence of a moderate size right pleural effusion likely with associated basilar atelectasis. Mild prominence of the perihilar markings likely degree of vascular congestion. Stable cardiomegaly. Remainder of the exam is unchanged. IMPRESSION: Worsening moderate size right pleural effusion likely with associated right basilar  atelectasis. Stable cardiomegaly with mild vascular congestion. Electronically Signed   By: Elberta Fortis M.D.   On: 07/04/2017 21:15        Scheduled Meds: . aspirin EC  81 mg Oral Daily  . atenolol  50 mg Oral Daily  . citalopram  20 mg Oral Daily  . enoxaparin (LOVENOX) injection  30 mg Subcutaneous Q24H  . ipratropium-albuterol  3 mL Nebulization Q6H  . [START ON 07/06/2017] levothyroxine  50 mcg Oral QAC breakfast  . losartan  50 mg Oral Daily  . methylPREDNISolone (SOLU-MEDROL) injection  80 mg Intravenous Q8H   Continuous Infusions: . sodium chloride Stopped (07/05/17 0030)  . sodium chloride 75 mL/hr at 07/05/17 0513  . [START ON 07/06/2017] levofloxacin (LEVAQUIN) IV    . [START ON 07/06/2017] vancomycin       LOS: 1 day    Time spent: 40 minutes    WOODS, Roselind Messier, MD [facility] Pager [phone]   If 7PM-7AM, please contact night-coverage [email or web address] Password [email or web address] 07/05/2017, 1:55 PM

## 2017-07-05 NOTE — Progress Notes (Signed)
Pt is doing well on HFNC, no distress noted.  BiPAP is on standby.

## 2017-07-06 ENCOUNTER — Inpatient Hospital Stay (HOSPITAL_COMMUNITY): Payer: Medicare HMO

## 2017-07-06 LAB — BODY FLUID CELL COUNT WITH DIFFERENTIAL
EOS FL: 0 %
Lymphs, Fluid: 9 %
Monocyte-Macrophage-Serous Fluid: 0 % — ABNORMAL LOW (ref 50–90)
Neutrophil Count, Fluid: 91 % — ABNORMAL HIGH (ref 0–25)
Total Nucleated Cell Count, Fluid: 675 cu mm (ref 0–1000)

## 2017-07-06 LAB — BASIC METABOLIC PANEL
Anion gap: 11 (ref 5–15)
BUN: 42 mg/dL — AB (ref 6–20)
CHLORIDE: 109 mmol/L (ref 101–111)
CO2: 24 mmol/L (ref 22–32)
Calcium: 9.2 mg/dL (ref 8.9–10.3)
Creatinine, Ser: 1.03 mg/dL — ABNORMAL HIGH (ref 0.44–1.00)
GFR calc Af Amer: 54 mL/min — ABNORMAL LOW (ref >60)
GFR, EST NON AFRICAN AMERICAN: 47 mL/min — AB (ref >60)
GLUCOSE: 143 mg/dL — AB (ref 65–99)
Potassium: 4.4 mmol/L (ref 3.5–5.1)
Sodium: 144 mmol/L (ref 135–145)

## 2017-07-06 LAB — ALBUMIN, PLEURAL OR PERITONEAL FLUID: Albumin, Fluid: 1.4 g/dL

## 2017-07-06 LAB — URINE CULTURE: CULTURE: NO GROWTH

## 2017-07-06 LAB — GRAM STAIN

## 2017-07-06 LAB — PROTEIN, PLEURAL OR PERITONEAL FLUID

## 2017-07-06 LAB — RETICULOCYTES
RBC.: 3.59 MIL/uL — ABNORMAL LOW (ref 3.87–5.11)
RETIC COUNT ABSOLUTE: 154.4 10*3/uL (ref 19.0–186.0)
Retic Ct Pct: 4.3 % — ABNORMAL HIGH (ref 0.4–3.1)

## 2017-07-06 LAB — CBC
HEMATOCRIT: 36.2 % (ref 36.0–46.0)
Hemoglobin: 10.6 g/dL — ABNORMAL LOW (ref 12.0–15.0)
MCH: 29.5 pg (ref 26.0–34.0)
MCHC: 29.3 g/dL — AB (ref 30.0–36.0)
MCV: 100.8 fL — AB (ref 78.0–100.0)
Platelets: 155 10*3/uL (ref 150–400)
RBC: 3.59 MIL/uL — ABNORMAL LOW (ref 3.87–5.11)
RDW: 16.3 % — ABNORMAL HIGH (ref 11.5–15.5)
WBC: 13.2 10*3/uL — ABNORMAL HIGH (ref 4.0–10.5)

## 2017-07-06 LAB — IRON AND TIBC
IRON: 10 ug/dL — AB (ref 28–170)
SATURATION RATIOS: 4 % — AB (ref 10.4–31.8)
TIBC: 286 ug/dL (ref 250–450)
UIBC: 276 ug/dL

## 2017-07-06 LAB — HEPATITIS PANEL, ACUTE
HEP B S AG: NEGATIVE
Hep A IgM: NEGATIVE
Hep B C IgM: NEGATIVE

## 2017-07-06 LAB — GLUCOSE, PLEURAL OR PERITONEAL FLUID: Glucose, Fluid: 151 mg/dL

## 2017-07-06 LAB — VITAMIN B12: VITAMIN B 12: 4839 pg/mL — AB (ref 180–914)

## 2017-07-06 LAB — FERRITIN: Ferritin: 60 ng/mL (ref 11–307)

## 2017-07-06 LAB — LACTIC ACID, PLASMA: LACTIC ACID, VENOUS: 2.5 mmol/L — AB (ref 0.5–1.9)

## 2017-07-06 LAB — LACTATE DEHYDROGENASE, PLEURAL OR PERITONEAL FLUID: LD, Fluid: 81 U/L — ABNORMAL HIGH (ref 3–23)

## 2017-07-06 LAB — FOLATE: FOLATE: 25 ng/mL (ref >5.9)

## 2017-07-06 LAB — MAGNESIUM: Magnesium: 1.7 mg/dL (ref 1.7–2.4)

## 2017-07-06 LAB — LEGIONELLA PNEUMOPHILA SEROGP 1 UR AG: L. PNEUMOPHILA SEROGP 1 UR AG: NEGATIVE

## 2017-07-06 MED ORDER — LEVOTHYROXINE SODIUM 50 MCG PO TABS: 50.0000 ug | ORAL_TABLET | Freq: Every day | ORAL | Status: DC

## 2017-07-06 MED ORDER — ACETAMINOPHEN 160 MG/5ML PO SOLN
650.0000 mg | Freq: Four times a day (QID) | ORAL | Status: DC | PRN
  Administered 2017-07-06 – 2017-07-07 (×2): 650 mg via ORAL
  Filled 2017-07-06 (×2): qty 20.3

## 2017-07-06 MED ORDER — DONEPEZIL HCL 5 MG PO TABS: 10.0000 mg | ORAL_TABLET | Freq: Every day | ORAL | Status: DC

## 2017-07-06 MED ORDER — ORAL CARE MOUTH RINSE
15.0000 mL | Freq: Two times a day (BID) | OROMUCOSAL | Status: DC
  Administered 2017-07-06 – 2017-07-10 (×9): 15 mL via OROMUCOSAL

## 2017-07-06 MED ORDER — ALBUTEROL SULFATE (2.5 MG/3ML) 0.083% IN NEBU: 2.5000 mg | INHALATION_SOLUTION | RESPIRATORY_TRACT | Status: DC | PRN

## 2017-07-06 MED ORDER — DONEPEZIL HCL 5 MG PO TABS: 5.0000 mg | ORAL_TABLET | Freq: Every day | ORAL | Status: DC

## 2017-07-06 MED ORDER — LIDOCAINE-EPINEPHRINE 1 %-1:100000 IJ SOLN
INTRAMUSCULAR | Status: AC
  Filled 2017-07-06: qty 1

## 2017-07-06 MED ORDER — SODIUM CHLORIDE 0.9% FLUSH: 10.0000 mL | INTRAVENOUS | Status: DC | PRN

## 2017-07-06 MED ORDER — DONEPEZIL HCL 5 MG PO TABS
5.0000 mg | ORAL_TABLET | Freq: Every day | ORAL | Status: DC
  Administered 2017-07-06 – 2017-07-09 (×4): 5 mg via ORAL
  Filled 2017-07-06 (×4): qty 1

## 2017-07-06 MED ORDER — METHYLPREDNISOLONE SODIUM SUCC 125 MG IJ SOLR
60.0000 mg | Freq: Every day | INTRAMUSCULAR | Status: DC
  Administered 2017-07-07: 60 mg via INTRAVENOUS
  Filled 2017-07-06: qty 2

## 2017-07-06 NOTE — Progress Notes (Signed)
[place] TEAM 1 - Stepdown/ICU TEAM  Rhonda Moore  ZOX:[id number] DOB: August 14, 1925 DOA: 07/04/2017 PCP: [name]., MD    Brief Narrative:  83yo F w/ a Hx of dementia, Thoracic aortic aneurysm, RIGHT iliac artery aneurysm, B carotid stenosis, HTN, HLD, CKD stage 3, Anemia, Thrombocytopenia, Hypothyroidism, COPD, Bronchiectasis, and cystic pancreatic cystic lesion who presented w/ a cough w yellow green sputum for 2 weeks, leading to dyspnea and AMS on the day of her presentation.   In ED a CXR noted a moderate size right pleural effusion with associated right basilar atelectasis.   Significant Events: 2/28 admit w/ dyspnea  3/1 TTE -  3/2 R thoracentesis - 1L serous fluid   Subjective: Resting comfortably on [place] w/ no acute distress.  Somnolent.  Spoke w/ son at bedside.  No uncontrolled pain.    Assessment & Plan:  Acute hypoxic respiratory failure w/ Sepsis due to LUL PNA (PCN allergy) Cont abx tx - still requiring signif O2 support, but has been liberated from BIPAP   R Pleural Effusion  IR diagnostic/therapeutic thoracentesis accomplished today - follow studies/cultures   Carotid stenosis  Possible L atrial appendage clot  Given advanced age and signif dementia, w/ recent falls, she would not be an anticoag candidate regardless, therefore further w/u is not warranted  HTN BP well controlled   Hx Pancreatic cyst masses CT chest/abdomen/pelvis: essentially unchanged compared to prior exams   Transaminitis  Acute hepatitis panel negative     AKI Hold ACEI/ARB  Anemia Hgb stable - no evidence of acute blood loss  Dementia  Hypothyroidism Cont Synthroid   DVT prophylaxis: lovenox  Code Status: DNR - NO CODE Family Communication: spoke w/ son at bedside  Disposition Plan: pending   Consultants:  none  Antimicrobials:  Levaquin 2/28 > Vanc 2/28 >   Objective: Blood pressure 104/74, pulse 94, temperature 97.7 F (36.5 C), temperature source Oral,  resp. rate 20, height 5\' 6"  (1.676 m), weight 63.2 kg (139 lb 5.3 oz), SpO2 96 %.  Intake/Output Summary (Last 24 hours) at 07/06/2017 1631 Last data filed at 07/06/2017 0645 Gross per 24 hour  Intake 1443.75 ml  Output 250 ml  Net 1193.75 ml   Filed Weights   07/04/17 2052 07/06/17 0558  Weight: 52.2 kg (115 lb) 63.2 kg (139 lb 5.3 oz)    Examination: General: No acute respiratory distress at rest - requiring O2 support  Lungs: fine crackles th/o - no wheezing  Cardiovascular: Regular rate and rhythm without murmur Abdomen: Nontender, nondistended, soft, bowel sounds positive, no rebound, no ascites, no appreciable mass Extremities: No significant cyanosis, clubbing, or edema bilateral lower extremities  CBC: Recent Labs  Lab 07/04/17 2028 07/05/17 0414 07/06/17 0436  WBC 15.4* 15.7* 13.2*  NEUTROABS 12.3*  --   --   HGB 8.8* 9.3* 10.6*  HCT 29.5* 30.8* 36.2  MCV 101.0* 100.3* 100.8*  PLT 124* 121* 155   Basic Metabolic Panel: Recent Labs  Lab 07/04/17 2028 07/05/17 0414 07/06/17 0436  NA 140 141 144  K 4.3 4.0 4.4  CL 103 107 109  CO2 23 24 24   GLUCOSE 125* 134* 143*  BUN 39* 38* 42*  CREATININE 1.29* 1.06* 1.03*  CALCIUM 9.1 8.8* 9.2  MG  --   --  1.7   GFR: Estimated Creatinine Clearance: 34.7 mL/min (A) (by C-G formula based on SCr of 1.03 mg/dL (H)).  Liver Function Tests: Recent Labs  Lab 07/04/17 2028 07/05/17 0414  AST  1,193* 1,119*  ALT 608* 627*  ALKPHOS 67 53  BILITOT 2.7* 2.0*  PROT 6.1* 5.5*  ALBUMIN 3.0* 2.7*   Recent Labs  Lab 07/04/17 2311  LIPASE 22    Cardiac Enzymes: Recent Labs  Lab 07/05/17 0414 07/05/17 0813 07/05/17 1414  TROPONINI 0.03* 0.03* 0.03*    CBG: Recent Labs  Lab 07/04/17 2027  GLUCAP 140*    Recent Results (from the past 240 hour(s))  Blood Cultures (routine x 2)     Status: None (Preliminary result)   Collection Time: 07/04/17  8:40 PM  Result Value Ref Range Status   Specimen Description BLOOD  SITE NOT SPECIFIED  Final   Special Requests IN PEDIATRIC BOTTLE Blood Culture adequate volume  Final   Culture   Final    NO GROWTH 2 DAYS Performed at [facility], [address] [place], [place] [address]    Report Status PENDING  Incomplete  Urine culture     Status: None   Collection Time: 07/04/17  8:48 PM  Result Value Ref Range Status   Specimen Description URINE, CATHETERIZED  Final   Special Requests NONE  Final   Culture   Final    NO GROWTH Performed at [facility], [address], [place], [place] [address]    Report Status 07/06/2017 FINAL  Final  Blood Cultures (routine x 2)     Status: None (Preliminary result)   Collection Time: 07/04/17  9:15 PM  Result Value Ref Range Status   Specimen Description BLOOD RIGHT ANTECUBITAL  Final   Special Requests   Final    BOTTLES DRAWN AEROBIC AND ANAEROBIC Blood Culture adequate volume   Culture   Final    NO GROWTH 2 DAYS Performed at [facility], [address] [place], [place] [address]    Report Status PENDING  Incomplete  Respiratory Panel by PCR     Status: None   Collection Time: 07/05/17 10:33 AM  Result Value Ref Range Status   Adenovirus NOT DETECTED NOT DETECTED Final   Coronavirus 229E NOT DETECTED NOT DETECTED Final   Coronavirus HKU1 NOT DETECTED NOT DETECTED Final   Coronavirus NL63 NOT DETECTED NOT DETECTED Final   Coronavirus OC43 NOT DETECTED NOT DETECTED Final   Metapneumovirus NOT DETECTED NOT DETECTED Final   Rhinovirus / Enterovirus NOT DETECTED NOT DETECTED Final   Influenza A NOT DETECTED NOT DETECTED Final   Influenza B NOT DETECTED NOT DETECTED Final   Parainfluenza Virus 1 NOT DETECTED NOT DETECTED Final   Parainfluenza Virus 2 NOT DETECTED NOT DETECTED Final   Parainfluenza Virus 3 NOT DETECTED NOT DETECTED Final   Parainfluenza Virus 4 NOT DETECTED NOT DETECTED Final   Respiratory Syncytial Virus NOT DETECTED NOT DETECTED Final   Bordetella pertussis NOT DETECTED  NOT DETECTED Final   Chlamydophila pneumoniae NOT DETECTED NOT DETECTED Final   Mycoplasma pneumoniae NOT DETECTED NOT DETECTED Final  Gram stain     Status: None   Collection Time: 07/06/17 11:14 AM  Result Value Ref Range Status   Specimen Description PLEURAL  Final   Special Requests NONE  Final   Gram Stain   Final    MODERATE WBC PRESENT, PREDOMINANTLY PMN NO ORGANISMS SEEN Performed at [facility], [address], [place], [place] [address]    Report Status 07/06/2017 FINAL  Final     Scheduled Meds: . aspirin EC  81 mg Oral Daily  . atenolol  50  mg Oral Daily  . citalopram  20 mg Oral Daily  . dextromethorphan-guaiFENesin  1 tablet Oral BID  . enoxaparin (LOVENOX) injection  30 mg Subcutaneous Q24H  . ipratropium-albuterol  3 mL Nebulization Q6H  . levothyroxine  50 mcg Oral QAC breakfast  . lidocaine-EPINEPHrine      . mouth rinse  15 mL Mouth Rinse BID  . methylPREDNISolone (SOLU-MEDROL) injection  80 mg Intravenous Daily     LOS: 2 days   Lonia BloodJeffrey T. Ioanna Colquhoun, MD [facility] Office  [phone] Pager - Text Page per Amion as per below:  On-Call/Text Page:      [email or web address]      password [email or web address]  If 7PM-7AM, please contact night-coverage [email or web address] Password [email or web address] 07/06/2017, 4:31 PM

## 2017-07-06 NOTE — Procedures (Signed)
Pre procedural Dx: Symptomatic Pleural effusion Post procedural Dx: Same  Successful US guided right sided thoracentesis yielding 1 L of serous pleural fluid.   Samples sent to lab for analysis.  EBL: None  Complications: None immediate.  Jay Rein Popov, MD Pager #: [phone]   

## 2017-07-07 LAB — COMPREHENSIVE METABOLIC PANEL
ALT: 310 U/L — ABNORMAL HIGH (ref 14–54)
AST: 209 U/L — ABNORMAL HIGH (ref 15–41)
Albumin: 2.4 g/dL — ABNORMAL LOW (ref 3.5–5.0)
Alkaline Phosphatase: 74 U/L (ref 38–126)
Anion gap: 9 (ref 5–15)
BILIRUBIN TOTAL: 1.4 mg/dL — AB (ref 0.3–1.2)
BUN: 46 mg/dL — ABNORMAL HIGH (ref 6–20)
CO2: 22 mmol/L (ref 22–32)
CREATININE: 1 mg/dL (ref 0.44–1.00)
Calcium: 8.9 mg/dL (ref 8.9–10.3)
Chloride: 110 mmol/L (ref 101–111)
GFR, EST AFRICAN AMERICAN: 56 mL/min — AB (ref >60)
GFR, EST NON AFRICAN AMERICAN: 48 mL/min — AB (ref >60)
Glucose, Bld: 143 mg/dL — ABNORMAL HIGH (ref 65–99)
POTASSIUM: 4.7 mmol/L (ref 3.5–5.1)
Sodium: 141 mmol/L (ref 135–145)
TOTAL PROTEIN: 5.1 g/dL — AB (ref 6.5–8.1)

## 2017-07-07 LAB — CBC
HCT: 34.9 % — ABNORMAL LOW (ref 36.0–46.0)
Hemoglobin: 10.5 g/dL — ABNORMAL LOW (ref 12.0–15.0)
MCH: 30.3 pg (ref 26.0–34.0)
MCHC: 30.1 g/dL (ref 30.0–36.0)
MCV: 100.6 fL — AB (ref 78.0–100.0)
PLATELETS: 146 10*3/uL — AB (ref 150–400)
RBC: 3.47 MIL/uL — ABNORMAL LOW (ref 3.87–5.11)
RDW: 16.2 % — ABNORMAL HIGH (ref 11.5–15.5)
WBC: 20.2 10*3/uL — ABNORMAL HIGH (ref 4.0–10.5)

## 2017-07-07 LAB — MAGNESIUM: MAGNESIUM: 1.8 mg/dL (ref 1.7–2.4)

## 2017-07-07 MED ORDER — IPRATROPIUM-ALBUTEROL 0.5-2.5 (3) MG/3ML IN SOLN
3.0000 mL | Freq: Two times a day (BID) | RESPIRATORY_TRACT | Status: DC
  Administered 2017-07-07 – 2017-07-10 (×7): 3 mL via RESPIRATORY_TRACT
  Filled 2017-07-07 (×9): qty 3

## 2017-07-07 NOTE — Progress Notes (Signed)
[place] TEAM 1 - Stepdown/ICU TEAM  San JettyRuby C Bordonaro  NWG:[id number] DOB: 06/10/1925 DOA: 07/04/2017 PCP: Laqueta DueFurr, Sara M., MD    Brief Narrative:  84yo F w/ a Hx of dementia, thoracic aortic aneurysm, R iliac artery aneurysm, B carotid stenosis, HTN, HLD, CKD stage 3, Anemia, Thrombocytopenia, Hypothyroidism, COPD, Bronchiectasis, and cystic pancreatic lesions who presented w/ a cough w/ yellow green sputum for 2 weeks, leading to dyspnea and AMS on the day of her presentation.   In the ED a CXR noted a moderate size right pleural effusion with associated right basilar atelectasis.   Significant Events: 2/28 admit w/ dyspnea  3/1 TTE -  3/2 R thoracentesis - 1L serous fluid   Subjective: Pt is more alert and interactive at the time of my visit, though family reports she has been sleeping most of the day.  She denies cp, n/v, or abdom pain.  She does not appear to be in any acute distress.    Assessment & Plan:  Acute hypoxic respiratory failure w/ Sepsis due to LUL PNA (PCN allergy) Cont abx tx - wean O2 support as able - presently requiring 5L HFNC  R Pleural Effusion  IR diagnostic/therapeutic thoracentesis accomplished 3/2 - studies most c/w transudate - follow cultures   Carotid stenosis  Possible L atrial appendage clot  Given advanced age and signif dementia, w/ recent falls, she would not be an anticoag candidate regardless, therefore further w/u is not warranted  HTN BP well controlled   Hx Pancreatic cystic masses CT chest/abdomen/pelvis: essentially unchanged compared to prior exams   Transaminitis  Acute hepatitis panel negative - rapidly improving   Recent Labs  Lab 07/04/17 2028 07/05/17 0414 07/07/17 0834  AST 1,193* 1,119* 209*  ALT 608* 627* 310*  ALKPHOS 67 53 74  BILITOT 2.7* 2.0* 1.4*  PROT 6.1* 5.5* 5.1*  ALBUMIN 3.0* 2.7* 2.4*    AKI Hold ACEI/ARB - crt improving   Anemia Hgb stable - no evidence of acute blood loss - B12 and folate normal  - Fe low   Dementia  Hypothyroidism Cont Synthroid   DVT prophylaxis: lovenox  Code Status: DNR - NO CODE Family Communication: spoke w/ grandson at bedside  Disposition Plan: unclear at this time   Consultants:  none  Antimicrobials:  Levaquin 2/28 > Vanc 2/28 >   Objective: Blood pressure 102/74, pulse 91, temperature (!) 97.4 F (36.3 C), temperature source Oral, resp. rate (!) 21, height 5\' 6"  (1.676 m), weight 62.9 kg (138 lb 10.7 oz), SpO2 98 %.  Intake/Output Summary (Last 24 hours) at 07/07/2017 1619 Last data filed at 07/07/2017 0300 Gross per 24 hour  Intake 2597.08 ml  Output 700 ml  Net 1897.08 ml   Filed Weights   07/04/17 2052 07/06/17 0558 07/07/17 0219  Weight: 52.2 kg (115 lb) 63.2 kg (139 lb 5.3 oz) 62.9 kg (138 lb 10.7 oz)    Examination: General: No acute respiratory distress Lungs: fine crackles th/o but worse in bases - no wheezing  Cardiovascular: RRR - 3/6 systolic M  Abdomen: NT/ND, soft, bs+, no mass  Extremities: No signif edema bilateral lower extremities  CBC: Recent Labs  Lab 07/04/17 2028 07/05/17 0414 07/06/17 0436 07/07/17 0834  WBC 15.4* 15.7* 13.2* 20.2*  NEUTROABS 12.3*  --   --   --   HGB 8.8* 9.3* 10.6* 10.5*  HCT 29.5* 30.8* 36.2 34.9*  MCV 101.0* 100.3* 100.8* 100.6*  PLT 124* 121* 155 146*   Basic Metabolic Panel:  Recent Labs  Lab 07/04/17 2028 07/05/17 0414 07/06/17 0436 07/07/17 0834  NA 140 141 144 141  K 4.3 4.0 4.4 4.7  CL 103 107 109 110  CO2 23 24 24 22   GLUCOSE 125* 134* 143* 143*  BUN 39* 38* 42* 46*  CREATININE 1.29* 1.06* 1.03* 1.00  CALCIUM 9.1 8.8* 9.2 8.9  MG  --   --  1.7 1.8   GFR: Estimated Creatinine Clearance: 35.7 mL/min (by C-G formula based on SCr of 1 mg/dL).  Liver Function Tests: Recent Labs  Lab 07/04/17 2028 07/05/17 0414 07/07/17 0834  AST 1,193* 1,119* 209*  ALT 608* 627* 310*  ALKPHOS 67 53 74  BILITOT 2.7* 2.0* 1.4*  PROT 6.1* 5.5* 5.1*  ALBUMIN 3.0* 2.7* 2.4*    Recent Labs  Lab 07/04/17 2311  LIPASE 22    Cardiac Enzymes: Recent Labs  Lab 07/05/17 0414 07/05/17 0813 07/05/17 1414  TROPONINI 0.03* 0.03* 0.03*    CBG: Recent Labs  Lab 07/04/17 2027  GLUCAP 140*    Recent Results (from the past 240 hour(s))  Blood Cultures (routine x 2)     Status: None (Preliminary result)   Collection Time: 07/04/17  8:40 PM  Result Value Ref Range Status   Specimen Description BLOOD SITE NOT SPECIFIED  Final   Special Requests IN PEDIATRIC BOTTLE Blood Culture adequate volume  Final   Culture   Final    NO GROWTH 3 DAYS Performed at [facility], [address] [place], [place] [address]    Report Status PENDING  Incomplete  Urine culture     Status: None   Collection Time: 07/04/17  8:48 PM  Result Value Ref Range Status   Specimen Description URINE, CATHETERIZED  Final   Special Requests NONE  Final   Culture   Final    NO GROWTH Performed at [facility], [address] [place], [place] [address]    Report Status 07/06/2017 FINAL  Final  Blood Cultures (routine x 2)     Status: None (Preliminary result)   Collection Time: 07/04/17  9:15 PM  Result Value Ref Range Status   Specimen Description BLOOD RIGHT ANTECUBITAL  Final   Special Requests   Final    BOTTLES DRAWN AEROBIC AND ANAEROBIC Blood Culture adequate volume   Culture   Final    NO GROWTH 3 DAYS Performed at [facility], [address], [place], [place] [address]    Report Status PENDING  Incomplete  Respiratory Panel by PCR     Status: None   Collection Time: 07/05/17 10:33 AM  Result Value Ref Range Status   Adenovirus NOT DETECTED NOT DETECTED Final   Coronavirus 229E NOT DETECTED NOT DETECTED Final   Coronavirus HKU1 NOT DETECTED NOT DETECTED Final   Coronavirus NL63 NOT DETECTED NOT DETECTED Final   Coronavirus OC43 NOT DETECTED NOT DETECTED Final   Metapneumovirus NOT DETECTED NOT DETECTED Final   Rhinovirus / Enterovirus NOT DETECTED  NOT DETECTED Final   Influenza A NOT DETECTED NOT DETECTED Final   Influenza B NOT DETECTED NOT DETECTED Final   Parainfluenza Virus 1 NOT DETECTED NOT DETECTED Final   Parainfluenza Virus 2 NOT DETECTED NOT DETECTED Final   Parainfluenza Virus 3 NOT DETECTED NOT DETECTED Final   Parainfluenza Virus 4 NOT DETECTED NOT DETECTED Final   Respiratory Syncytial Virus NOT DETECTED NOT DETECTED Final   Bordetella pertussis NOT DETECTED NOT DETECTED Final   Chlamydophila pneumoniae  NOT DETECTED NOT DETECTED Final   Mycoplasma pneumoniae NOT DETECTED NOT DETECTED Final  Culture, body fluid-bottle     Status: None (Preliminary result)   Collection Time: 07/06/17 11:14 AM  Result Value Ref Range Status   Specimen Description PLEURAL  Final   Special Requests NONE  Final   Culture   Final    NO GROWTH 1 DAY Performed at [facility], [address] [place], [place] [address]    Report Status PENDING  Incomplete  Gram stain     Status: None   Collection Time: 07/06/17 11:14 AM  Result Value Ref Range Status   Specimen Description PLEURAL  Final   Special Requests NONE  Final   Gram Stain   Final    MODERATE WBC PRESENT, PREDOMINANTLY PMN NO ORGANISMS SEEN Performed at [facility], [address] [place], [place] [address]    Report Status 07/06/2017 FINAL  Final     Scheduled Meds: . aspirin EC  81 mg Oral Daily  . atenolol  50 mg Oral Daily  . citalopram  20 mg Oral Daily  . dextromethorphan-guaiFENesin  1 tablet Oral BID  . donepezil  5 mg Oral Q supper   Followed by  . [START ON 07/17/2017] donepezil  10 mg Oral Q supper  . enoxaparin (LOVENOX) injection  30 mg Subcutaneous Q24H  . ipratropium-albuterol  3 mL Nebulization BID  . levothyroxine  50 mcg Oral QAC breakfast  . mouth rinse  15 mL Mouth Rinse BID  . methylPREDNISolone (SOLU-MEDROL) injection  60 mg Intravenous Daily     LOS: 3 days   Lonia Blood, MD [facility] Office   [phone] Pager - Text Page per Amion as per below:  On-Call/Text Page:      [email or web address]      password [email or web address]  If 7PM-7AM, please contact night-coverage [email or web address] Password [facility] 07/07/2017, 4:19 PM

## 2017-07-07 NOTE — Progress Notes (Signed)
PT was eating and did not want CPT

## 2017-07-08 ENCOUNTER — Inpatient Hospital Stay (HOSPITAL_COMMUNITY): Payer: Medicare HMO

## 2017-07-08 LAB — MAGNESIUM: MAGNESIUM: 1.7 mg/dL (ref 1.7–2.4)

## 2017-07-08 LAB — CBC
HEMATOCRIT: 37.5 % (ref 36.0–46.0)
HEMOGLOBIN: 11.2 g/dL — AB (ref 12.0–15.0)
MCH: 30 pg (ref 26.0–34.0)
MCHC: 29.9 g/dL — AB (ref 30.0–36.0)
MCV: 100.5 fL — ABNORMAL HIGH (ref 78.0–100.0)
Platelets: 132 10*3/uL — ABNORMAL LOW (ref 150–400)
RBC: 3.73 MIL/uL — ABNORMAL LOW (ref 3.87–5.11)
RDW: 16.3 % — ABNORMAL HIGH (ref 11.5–15.5)
WBC: 18.8 10*3/uL — ABNORMAL HIGH (ref 4.0–10.5)

## 2017-07-08 LAB — PH, BODY FLUID: PH, BODY FLUID: 7.4

## 2017-07-08 LAB — COMPREHENSIVE METABOLIC PANEL
ALK PHOS: 53 U/L (ref 38–126)
ALT: 289 U/L — AB (ref 14–54)
ANION GAP: 10 (ref 5–15)
AST: 168 U/L — ABNORMAL HIGH (ref 15–41)
Albumin: 2.5 g/dL — ABNORMAL LOW (ref 3.5–5.0)
BUN: 40 mg/dL — ABNORMAL HIGH (ref 6–20)
CO2: 21 mmol/L — ABNORMAL LOW (ref 22–32)
CREATININE: 0.87 mg/dL (ref 0.44–1.00)
Calcium: 9 mg/dL (ref 8.9–10.3)
Chloride: 110 mmol/L (ref 101–111)
GFR, EST NON AFRICAN AMERICAN: 57 mL/min — AB (ref >60)
Glucose, Bld: 138 mg/dL — ABNORMAL HIGH (ref 65–99)
Potassium: 4.2 mmol/L (ref 3.5–5.1)
Sodium: 141 mmol/L (ref 135–145)
Total Bilirubin: 1.4 mg/dL — ABNORMAL HIGH (ref 0.3–1.2)
Total Protein: 5.2 g/dL — ABNORMAL LOW (ref 6.5–8.1)

## 2017-07-08 MED ORDER — ATENOLOL 25 MG PO TABS
12.5000 mg | ORAL_TABLET | Freq: Every day | ORAL | Status: DC
  Filled 2017-07-08: qty 1

## 2017-07-08 NOTE — Evaluation (Signed)
Occupational Therapy Evaluation Patient Details Name: Rhonda Moore MRN: [id number] DOB: 11/23/1925 Today's Date: 07/08/2017    History of Present Illness 84yo female with history of dementia, thoracic aortic aneurysm, RIGHT iliac artery aneurysm, B carotid stenosis, HTN, HLD, CKD stage 3, Anemia, Thrombocytopenia, Hypothyroidism, COPD, Bronchiectasis, and cystic pancreatic cystic lesion. She presented with a cough with yellow green sputum for 2 weeks, leading to dyspnea and AMS on the day of her presentation.    Clinical Impression   Limited evaluation to bed level today as pt with low blood pressure (92/37 and 88/36 on re-check). Notified RN. Pt does demonstrate some confusion (has a history of dementia) but willing to participate in bed level ADL at this time. She demonstrates decreased peripheral vision, generalized weakness, and reports B LE pain impacting her ability to participate today. She requires set-up for grooming and feeding tasks, min assist for UB ADL, and total assist for LB ADL at bed level. Pt reports that her son lives with her but unable to report full PLOF. She may benefit from SNF level rehabilitation depending on progress once more medically stable. Will continue to update recommendations once pt able to progress.    Follow Up Recommendations  SNF;Home health OT;Supervision/Assistance - 24 hour    Equipment Recommendations  Other (comment)(TBD once verify what equipment she has)    Recommendations for Other Services       Precautions / Restrictions Precautions Precautions: Fall Restrictions Weight Bearing Restrictions: No      Mobility Bed Mobility                  Transfers                      Balance                                           ADL either performed or assessed with clinical judgement   ADL Overall ADL's : Needs assistance/impaired Eating/Feeding: Set up;Bed level   Grooming: Set up;Wash/dry face;Bed  level   Upper Body Bathing: Minimal assistance;Bed level   Lower Body Bathing: Total assistance;Bed level   Upper Body Dressing : Minimal assistance;Bed level   Lower Body Dressing: Total assistance;Bed level                 General ADL Comments: Limited ADL assessment today as pt hypotensive during session.      Vision Baseline Vision/History: Wears glasses Wears Glasses: At all times Patient Visual Report: No change from baseline Additional Comments: Limited peripheral vision noted but interacts with therapist well when approached in central vision.      Perception     Praxis      Pertinent Vitals/Pain Pain Assessment: Faces Faces Pain Scale: Hurts a little bit Pain Location: BLE Pain Descriptors / Indicators: (reports "my legs hurt") Pain Intervention(s): Monitored during session;Limited activity within patient's tolerance     Hand Dominance     Extremity/Trunk Assessment Upper Extremity Assessment Upper Extremity Assessment: Generalized weakness   Lower Extremity Assessment Lower Extremity Assessment: Defer to PT evaluation       Communication Communication Communication: No difficulties   Cognition Arousal/Alertness: Lethargic Behavior During Therapy: WFL for tasks assessed/performed Overall Cognitive Status: No family/caregiver present to determine baseline cognitive functioning  General Comments: History of dementia at baseline. Able to follow commands and converse well.    General Comments  Limited evaluation to bed level today as BP 92/37 and repeat 88/36. Notified RN.     Exercises     Shoulder Instructions      Home Living Family/patient expects to be discharged to:: Private residence Living Arrangements: Children(son) Available Help at Discharge: Family;Available PRN/intermittently(son works part time per pt)               Bathroom Shower/Tub: Tub/shower unit              Additional Comments: Inconsistent reports of home set-up and need to clarify on future visits.       Prior Functioning/Environment Level of Independence: Needs assistance        Comments: Son lives with her and does house work per pt. Pt with inconsistent reports but states that she is independent with ADL. Unable to report if she uses RW.         OT Problem List: Decreased strength;Decreased activity tolerance;Impaired balance (sitting and/or standing);Decreased safety awareness;Decreased knowledge of use of DME or AE;Decreased knowledge of precautions;Decreased cognition;Pain      OT Treatment/Interventions: Self-care/ADL training;Therapeutic exercise;Energy conservation;DME and/or AE instruction;Therapeutic activities;Patient/family education;Balance training    OT Goals(Current goals can be found in the care plan section) Acute Rehab OT Goals Patient Stated Goal: none stated OT Goal Formulation: With patient Time For Goal Achievement: 07/22/17 Potential to Achieve Goals: Good ADL Goals Pt Will Perform Upper Body Dressing: with supervision;sitting Pt Will Perform Lower Body Dressing: with min assist;sit to/from stand Pt Will Transfer to Toilet: with min assist;ambulating;bedside commode(BSC over toilet) Pt Will Perform Toileting - Clothing Manipulation and hygiene: with min assist;sit to/from stand  OT Frequency: Min 2X/week   Barriers to D/C:            Co-evaluation              AM-PAC PT "6 Clicks" Daily Activity     Outcome Measure Help from another person eating meals?: A Little Help from another person taking care of personal grooming?: A Little Help from another person toileting, which includes using toliet, bedpan, or urinal?: A Lot Help from another person bathing (including washing, rinsing, drying)?: A Lot Help from another person to put on and taking off regular upper body clothing?: A Little Help from another person to put on and taking off regular  lower body clothing?: Total 6 Click Score: 14   End of Session Equipment Utilized During Treatment: Gait belt;Rolling walker Nurse Communication: Mobility status;Other (comment)(Low BP)  Activity Tolerance: Treatment limited secondary to medical complications (Comment)(low BP) Patient left: in bed;with call bell/phone within reach;with bed alarm set  OT Visit Diagnosis: Other abnormalities of gait and mobility (R26.89);Muscle weakness (generalized) (M62.81);Other symptoms and signs involving cognitive function                Time: 1000-1020 OT Time Calculation (min): 20 min Charges:  OT General Charges $OT Visit: 1 Visit OT Evaluation $OT Eval Moderate Complexity: 1 Mod G-Codes:     Doristine Section, MS OTR/L  Pager: [phone]   Gianni Mihalik A Eudell Julian 07/08/2017, 12:13 PM

## 2017-07-08 NOTE — Care Management Important Message (Signed)
Important Message  Patient Details  Name: Rhonda Moore MRN: [id number] Date of Birth: 02/24/1926   Medicare Important Message Given:  Yes    Relda Agosto 07/08/2017, 12:09 PM

## 2017-07-08 NOTE — Progress Notes (Signed)
Pt has growing edema around her bilateral elbows. IV team nurse paged to see if could be caused from infiltration and when she flushed the Power Glide, said it was alright. IV fluid stopped in case of third spacing.  Bilateral arms are propped up. Will continue to monitor.  Rhonda Moore, Nishika Parkhurst E, RN

## 2017-07-08 NOTE — Progress Notes (Addendum)
0700 Bedside shift report, pt alert, confused, reoriented to time and situation. Pt with purewick in place, leaking, pad changed under pt and pt pulled up and repositioned. Pt grimaces to pain when repositioned, denies pain resting in bed. [facility]   0800 Pt assessed, see flowsheet. Pt alert and oriented to name and place only. Reoriented. Pt fed small bites of breakfast. Pt falls back to sleep easily, needs to be stimulated to keep eyes open. WCTM.   0900 Pt medicated per MAR. NAD, WCTM.   1030 OT attempted to work with pt, but pt's BP low, pt did perform some exercises in bed.   1150 RN to bedside, pt sleeping comfortably, easy to arouse. Family at bedside, stating she's very thirsty and ate about 1/2 her lunch. Family informed RN that MD was at bedside a short time ago. Possible repeat thoracentesis in future. Pt appears comfortably, BP low, MAP>65. MD paged regarding IVF and laxative orders, awaiting call back. WCTM.

## 2017-07-08 NOTE — Progress Notes (Signed)
Rhonda Moore  Rhonda Moore  EAV:[id number] DOB: 04/27/1926 DOA: 07/04/2017 PCP: Laqueta DueFurr, Sara M., MD    Brief Narrative:  84yo F w/ a Hx of dementia, thoracic aortic aneurysm, R iliac artery aneurysm, B carotid stenosis, HTN, HLD, CKD stage 3, Anemia, Thrombocytopenia, Hypothyroidism, COPD, Bronchiectasis, and cystic pancreatic lesions who presented w/ a cough w/ yellow green sputum for 2 weeks, leading to dyspnea and AMS on the day of her presentation.   In the ED a CXR noted a moderate size right pleural effusion with associated right basilar atelectasis.   Significant Events: 2/28 admit w/ dyspnea  3/1 TTE - EF 50-55% - hypokinesis of anteroseptal and inferoseptal myocardium - mild AoS w/ mod regurg - mod/severe tricuspid regurg - biatrial enlargement  3/2 R thoracentesis - 1L serous fluid   Subjective: The patient is more sedate today/less responsive.  Her daughter is at the bedside and confirms that she has interacted with her last today.  She shows no interest in eating.  She does not appear to be uncomfortable nor is she struggling to breathe.  I have discussed the serious nature of the patient's acute illness in the setting of her chronic pulmonary disease and my fear that given her advanced age that this may be the beginning of her ultimate decline.  The patient's daughter reports that she has noted a significant general decline in her mother's condition over the last 6 months.  We have agreed to continue our current management for 48 hours (until 07/10/17).  If no significant improvement has been appreciated in that time we will transition to a comfort focused treatment plan and investigate the possibility of a skilled nursing facility or hospice facility.  Assessment & Plan:  Acute hypoxic respiratory failure w/ Sepsis due to LUL PNA (PCN allergy) Cont abx tx - wean O2 support as able - presently requiring 6L HFNC - CXR today reveals worsening of infiltrates in B  LL, and increasing effusions   Acute worsening of chronic R Pleural Effusion  IR diagnostic/therapeutic thoracentesis accomplished 3/2 - studies most c/w transudate - cultures bland thus far - review of records notes this effusion dates back at to Dec 2017 - exact etiology unclear, but recurrent nature is quite problematic   Carotid stenosis  Possible L atrial appendage clot (per CT chest) Given advanced age and signif dementia, w/ recent falls, she would not be an anticoag candidate regardless, therefore further w/u is not warranted - not appreciated on TTE   HTN BP well controlled   Hx Pancreatic cystic masses CT chest/abdomen/pelvis: essentially unchanged compared to prior exams   Transaminitis  Acute hepatitis panel negative - rapidly improving   Recent Labs  Lab 07/04/17 2028 07/05/17 0414 07/07/17 0834 07/08/17 0754  AST 1,193* 1,119* 209* 168*  ALT 608* 627* 310* 289*  ALKPHOS 67 53 74 53  BILITOT 2.7* 2.0* 1.4* 1.4*  PROT 6.1* 5.5* 5.1* 5.2*  ALBUMIN 3.0* 2.7* 2.4* 2.5*    AKI Hold ACEI/ARB - crt has normalized   Recent Labs  Lab 07/04/17 2028 07/05/17 0414 07/06/17 0436 07/07/17 0834 07/08/17 0754  CREATININE 1.29* 1.06* 1.03* 1.00 0.87    Anemia Hgb stable - no evidence of acute blood loss - B12 and folate normal - Fe low   Dementia  Hypothyroidism Cont Synthroid   DVT prophylaxis: lovenox  Code Status: DNR - NO CODE Family Communication: spoke w/ daughter at bedside  Disposition Plan: cont conservative care util 07/10/17 -  if not marked improvement at that time, plan to transition to comfort focused care   Consultants:  none  Antimicrobials:  Levaquin 2/28 > Vanc 2/28 >   Objective: Blood pressure (!) 107/59, pulse 91, temperature (!) 97.4 F (36.3 C), temperature source Axillary, resp. rate 17, height 5\' 6"  (1.676 m), weight 66.3 kg (146 lb 2.6 oz), SpO2 92 %.  Intake/Output Summary (Last 24 hours) at 07/08/2017 1143 Last data filed  at 07/08/2017 0950 Gross per 24 hour  Intake 1137.5 ml  Output 500 ml  Net 637.5 ml   Filed Weights   07/06/17 0558 07/07/17 0219 07/08/17 0353  Weight: 63.2 kg (139 lb 5.3 oz) 62.9 kg (138 lb 10.7 oz) 66.3 kg (146 lb 2.6 oz)    Examination: General: modest resp distress - on 6L HFNC Lungs: fine crackles th/o - no air movement in bases - no wheeze  Cardiovascular: RRR - 3/6 systolic M  Abdomen: NT/ND, soft, bs+, no mass - thin  Extremities: No signif edema B LE   CBC: Recent Labs  Lab 07/04/17 2028 07/05/17 0414 07/06/17 0436 07/07/17 0834 07/08/17 0754  WBC 15.4* 15.7* 13.2* 20.2* 18.8*  NEUTROABS 12.3*  --   --   --   --   HGB 8.8* 9.3* 10.6* 10.5* 11.2*  HCT 29.5* 30.8* 36.2 34.9* 37.5  MCV 101.0* 100.3* 100.8* 100.6* 100.5*  PLT 124* 121* 155 146* 132*   Basic Metabolic Panel: Recent Labs  Lab 07/04/17 2028 07/05/17 0414 07/06/17 0436 07/07/17 0834 07/08/17 0754  NA 140 141 144 141 141  K 4.3 4.0 4.4 4.7 4.2  CL 103 107 109 110 110  CO2 23 24 24 22  21*  GLUCOSE 125* 134* 143* 143* 138*  BUN 39* 38* 42* 46* 40*  CREATININE 1.29* 1.06* 1.03* 1.00 0.87  CALCIUM 9.1 8.8* 9.2 8.9 9.0  MG  --   --  1.7 1.8 1.7   GFR: Estimated Creatinine Clearance: 41 mL/min (by C-G formula based on SCr of 0.87 mg/dL).  Liver Function Tests: Recent Labs  Lab 07/04/17 2028 07/05/17 0414 07/07/17 0834 07/08/17 0754  AST 1,193* 1,119* 209* 168*  ALT 608* 627* 310* 289*  ALKPHOS 67 53 74 53  BILITOT 2.7* 2.0* 1.4* 1.4*  PROT 6.1* 5.5* 5.1* 5.2*  ALBUMIN 3.0* 2.7* 2.4* 2.5*   Recent Labs  Lab 07/04/17 2311  LIPASE 22    Cardiac Enzymes: Recent Labs  Lab 07/05/17 0414 07/05/17 0813 07/05/17 1414  TROPONINI 0.03* 0.03* 0.03*    CBG: Recent Labs  Lab 07/04/17 2027  GLUCAP 140*    Recent Results (from the past 240 hour(s))  Blood Cultures (routine x 2)     Status: None (Preliminary result)   Collection Time: 07/04/17  8:40 PM  Result Value Ref Range  Status   Specimen Description BLOOD SITE NOT SPECIFIED  Final   Special Requests IN PEDIATRIC BOTTLE Blood Culture adequate volume  Final   Culture   Final    NO GROWTH 3 DAYS Performed at [facility], [address], [place], [place] [address]    Report Status PENDING  Incomplete  Urine culture     Status: None   Collection Time: 07/04/17  8:48 PM  Result Value Ref Range Status   Specimen Description URINE, CATHETERIZED  Final   Special Requests NONE  Final   Culture   Final    NO GROWTH Performed at [facility], [address] [place], [place] [address]  Report Status 07/06/2017 FINAL  Final  Blood Cultures (routine x 2)     Status: None (Preliminary result)   Collection Time: 07/04/17  9:15 PM  Result Value Ref Range Status   Specimen Description BLOOD RIGHT ANTECUBITAL  Final   Special Requests   Final    BOTTLES DRAWN AEROBIC AND ANAEROBIC Blood Culture adequate volume   Culture   Final    NO GROWTH 3 DAYS Performed at [facility], [address] [place], [place] [address]    Report Status PENDING  Incomplete  Respiratory Panel by PCR     Status: None   Collection Time: 07/05/17 10:33 AM  Result Value Ref Range Status   Adenovirus NOT DETECTED NOT DETECTED Final   Coronavirus 229E NOT DETECTED NOT DETECTED Final   Coronavirus HKU1 NOT DETECTED NOT DETECTED Final   Coronavirus NL63 NOT DETECTED NOT DETECTED Final   Coronavirus OC43 NOT DETECTED NOT DETECTED Final   Metapneumovirus NOT DETECTED NOT DETECTED Final   Rhinovirus / Enterovirus NOT DETECTED NOT DETECTED Final   Influenza A NOT DETECTED NOT DETECTED Final   Influenza B NOT DETECTED NOT DETECTED Final   Parainfluenza Virus 1 NOT DETECTED NOT DETECTED Final   Parainfluenza Virus 2 NOT DETECTED NOT DETECTED Final   Parainfluenza Virus 3 NOT DETECTED NOT DETECTED Final   Parainfluenza Virus 4 NOT DETECTED NOT DETECTED Final   Respiratory Syncytial Virus NOT DETECTED NOT DETECTED Final    Bordetella pertussis NOT DETECTED NOT DETECTED Final   Chlamydophila pneumoniae NOT DETECTED NOT DETECTED Final   Mycoplasma pneumoniae NOT DETECTED NOT DETECTED Final  Culture, body fluid-bottle     Status: None (Preliminary result)   Collection Time: 07/06/17 11:14 AM  Result Value Ref Range Status   Specimen Description PLEURAL  Final   Special Requests NONE  Final   Culture   Final    NO GROWTH 1 DAY Performed at [facility], [address] [place], [place] [address]    Report Status PENDING  Incomplete  Gram stain     Status: None   Collection Time: 07/06/17 11:14 AM  Result Value Ref Range Status   Specimen Description PLEURAL  Final   Special Requests NONE  Final   Gram Stain   Final    MODERATE WBC PRESENT, PREDOMINANTLY PMN NO ORGANISMS SEEN Performed at [facility], [address], [place], [place] [address]    Report Status 07/06/2017 FINAL  Final     Scheduled Meds: . aspirin EC  81 mg Oral Daily  . atenolol  50 mg Oral Daily  . citalopram  20 mg Oral Daily  . dextromethorphan-guaiFENesin  1 tablet Oral BID  . donepezil  5 mg Oral Q supper   Followed by  . [START ON 07/17/2017] donepezil  10 mg Oral Q supper  . enoxaparin (LOVENOX) injection  30 mg Subcutaneous Q24H  . ipratropium-albuterol  3 mL Nebulization BID  . levothyroxine  50 mcg Oral QAC breakfast  . mouth rinse  15 mL Mouth Rinse BID     LOS: 4 days   Lonia Blood, MD [facility] Office  [phone] Pager - Text Page per Loretha Stapler as per below:  On-Call/Text Page:      [email or web address]      password [email or web address]  If 7PM-7AM, please contact night-coverage [email or web address] Password [facility] 07/08/2017, 11:43 AM

## 2017-07-08 NOTE — Progress Notes (Signed)
PT Cancellation Note  Patient Details Name: Rhonda Moore MRN: [id number] DOB: 07/25/1925   Cancelled Treatment:    Reason Eval/Treat Not Completed: Medical issues which prohibited therapy. Pt with low BP today. Will try again tomorrow.   Angelina OkCary W Maycok 07/08/2017, 3:29 PM [employer] Corporation[employer]ary Sharlett Lienemann PT [phone]

## 2017-07-08 NOTE — Progress Notes (Signed)
Pharmacy Antibiotic Note  Rhonda Moore is a 84 y.o. female admitted on 07/04/2017 with altered mental status.  Pharmacy has been consulted for vancomycin and levaquin dosing.  On day #5 of antibiotic therapy. WBC 15.4>20.2>18.8 today (on methylprednisolone, which likely falsely elevating value). Renal function has improved (Scr 0.87, CrCl ~40 mL/min). No growth on any cultures to date.   Plan: Continue vancomycin 500 mg IV every 24 hours >> if plan to continue will check level prior to dose scheduled for 3/5 Continue levaquin 750mg  IV q48 hours Monitor for opportunities for de-escalation given no growth on cxs Monitor renal function, clinical pic, and VT as needed  Height: 5\' 6"  (167.6 cm) Weight: 146 lb 2.6 oz (66.3 kg) IBW/kg (Calculated) : 59.3  Temp (24hrs), Avg:97.4 F (36.3 C), Min:97.3 F (36.3 C), Max:97.4 F (36.3 C)  Recent Labs  Lab 07/04/17 2028 07/04/17 2037 07/04/17 2343 07/05/17 0414 07/05/17 0700 07/05/17 0813 07/06/17 0436 07/07/17 0834 07/08/17 0754  WBC 15.4*  --   --  15.7*  --   --  13.2* 20.2* 18.8*  CREATININE 1.29*  --   --  1.06*  --   --  1.03* 1.00 0.87  LATICACIDVEN  --  5.94* 4.69*  --  1.6 1.6 2.5*  --   --     Estimated Creatinine Clearance: 41 mL/min (by C-G formula based on SCr of 0.87 mg/dL).    Allergies  Allergen Reactions  . Hydrocodone Other (See Comments)    Unknown reaction  . Penicillins Other (See Comments)    Unknown reaction  Has patient had a PCN reaction causing immediate rash, facial/tongue/throat swelling, SOB or lightheadedness with hypotension: unknown Has patient had a PCN reaction causing severe rash involving mucus membranes or skin necrosis: unknown Has patient had a PCN reaction that required hospitalization unknown Has patient had a PCN reaction occurring within the last 10 years: no If all of the above answers are "NO", then may proceed with Cephalosporin use.     Thank you for allowing pharmacy to be a part  of this patient's care.  Girard CooterKimberly Perkins, PharmD Clinical Pharmacist  Pager: [phone] Clinical Phone for 07/08/2017 until 3:30pm: [phone] If after 3:30pm, please call main pharmacy at [phone] 07/08/2017 11:06 AM

## 2017-07-09 ENCOUNTER — Inpatient Hospital Stay (HOSPITAL_COMMUNITY): Payer: Medicare HMO

## 2017-07-09 LAB — CULTURE, BLOOD (ROUTINE X 2)
CULTURE: NO GROWTH
Culture: NO GROWTH
SPECIAL REQUESTS: ADEQUATE
Special Requests: ADEQUATE

## 2017-07-09 LAB — VANCOMYCIN, TROUGH: Vancomycin Tr: 9 ug/mL — ABNORMAL LOW (ref 15–20)

## 2017-07-09 MED ORDER — SODIUM CHLORIDE 0.9 % IV BOLUS (SEPSIS)
250.0000 mL | Freq: Once | INTRAVENOUS | Status: AC
  Administered 2017-07-09: 250 mL via INTRAVENOUS

## 2017-07-09 MED ORDER — VANCOMYCIN HCL IN DEXTROSE 750-5 MG/150ML-% IV SOLN
750.0000 mg | INTRAVENOUS | Status: DC
  Administered 2017-07-09 – 2017-07-10 (×2): 750 mg via INTRAVENOUS
  Filled 2017-07-09 (×2): qty 150

## 2017-07-09 MED ORDER — LORAZEPAM 0.5 MG PO TABS
0.5000 mg | ORAL_TABLET | Freq: Once | ORAL | Status: AC
  Administered 2017-07-09: 0.5 mg via ORAL
  Filled 2017-07-09: qty 1

## 2017-07-09 MED ORDER — MORPHINE SULFATE (PF) 2 MG/ML IV SOLN
1.0000 mg | INTRAVENOUS | Status: DC | PRN
  Administered 2017-07-09 – 2017-07-10 (×2): 1 mg via INTRAVENOUS
  Filled 2017-07-09 (×2): qty 1

## 2017-07-09 NOTE — Progress Notes (Signed)
Pharmacy Antibiotic Note  San JettyRuby C Witz is a 84 y.o. female admitted on 07/04/2017 with altered mental status.  Pharmacy has been consulted for vancomycin and levaquin dosing.  On day #6 of antibiotic therapy. Vancomycin trough this morning came back at 9, below goal range. WBC 15.4>20.2>18.8 yesterday - no repeat CBC today (on methylprednisolone, which likely falsely elevating value). Renal function had improved yesterday (Scr 0.87, CrCl ~40 mL/min). Cultures remain no growth to date.   Plan: Increase vancomycin to 750 mg IV every 24 hours  Continue levaquin 750mg  IV q48 hours Monitor for opportunities for de-escalation given no growth on cxs and consider stop date on orders Monitor renal function, clinical pic, and VT as needed  Height: 5\' 6"  (167.6 cm) Weight: 146 lb 2.6 oz (66.3 kg) IBW/kg (Calculated) : 59.3  Temp (24hrs), Avg:97.6 F (36.4 C), Min:97.3 F (36.3 C), Max:97.8 F (36.6 C)  Recent Labs  Lab 07/04/17 2028 07/04/17 2037 07/04/17 2343 07/05/17 0414 07/05/17 0700 07/05/17 0813 07/06/17 0436 07/07/17 0834 07/08/17 0754 07/09/17 0535  WBC 15.4*  --   --  15.7*  --   --  13.2* 20.2* 18.8*  --   CREATININE 1.29*  --   --  1.06*  --   --  1.03* 1.00 0.87  --   LATICACIDVEN  --  5.94* 4.69*  --  1.6 1.6 2.5*  --   --   --   VANCOTROUGH  --   --   --   --   --   --   --   --   --  9*    Estimated Creatinine Clearance: 41 mL/min (by C-G formula based on SCr of 0.87 mg/dL).    Allergies  Allergen Reactions  . Hydrocodone Other (See Comments)    Unknown reaction  . Penicillins Other (See Comments)    Unknown reaction  Has patient had a PCN reaction causing immediate rash, facial/tongue/throat swelling, SOB or lightheadedness with hypotension: unknown Has patient had a PCN reaction causing severe rash involving mucus membranes or skin necrosis: unknown Has patient had a PCN reaction that required hospitalization unknown Has patient had a PCN reaction occurring  within the last 10 years: no If all of the above answers are "NO", then may proceed with Cephalosporin use.     Thank you for allowing pharmacy to be a part of this patient's care.  Girard CooterKimberly Perkins, PharmD Clinical Pharmacist  Pager: [phone] Clinical Phone for 07/09/2017 until 3:30pm: [phone] If after 3:30pm, please call main pharmacy at [phone] 07/09/2017 7:33 AM

## 2017-07-09 NOTE — Progress Notes (Signed)
Pt continues to be agitated after prn and HR 140's to 150's paged Donnamarie PoagK Kirby who ordered .5 mg ativan

## 2017-07-09 NOTE — Progress Notes (Signed)
Pt bp 80/62. Paged on call MD. New orders given. Will continue to follow.

## 2017-07-09 NOTE — Progress Notes (Signed)
Pt has been agitated all night HR 130's-140's  afib pt complaning of back pain paged K kirby new orders received will continue to monitor

## 2017-07-09 NOTE — Evaluation (Signed)
Physical Therapy Evaluation Patient Details Name: Rhonda Moore MRN: [id number] DOB: March 24, 1926 Today's Date: 07/09/2017   History of Present Illness  84yo female with history of dementia, thoracic aortic aneurysm, RIGHT iliac artery aneurysm, B carotid stenosis, HTN, HLD, CKD stage 3, Anemia, Thrombocytopenia, Hypothyroidism, COPD, Bronchiectasis, and cystic pancreatic cystic lesion. She presented with a cough with yellow green sputum for 2 weeks, leading to dyspnea and AMS on the day of her presentation.  Pt found to have acute hypoxic respiratory failure due to sepsis due to PNA.   Clinical Impression  Pt presents to PT with poor mobility and significant confusion as well as low blood pressure. Pt with supine BP 82/47. Sat EOB briefly to reposition with BP 59/45. Returned pt to supine with BP 69/50. If pt's medical and cognitive status improve expect she will make steady progress. Pt may benefit from palliative consult.      Follow Up Recommendations SNF    Equipment Recommendations  Other (comment)(To be determined)    Recommendations for Other Services       Precautions / Restrictions Precautions Precautions: Fall Restrictions Weight Bearing Restrictions: No      Mobility  Bed Mobility Overal bed mobility: Needs Assistance Bed Mobility: Supine to Sit;Sit to Supine     Supine to sit: Max assist;HOB elevated Sit to supine: Mod assist   General bed mobility comments: assist to bring legs off bed, elevate trunk into sitting and bring hips to EOB. Assist to lower trunk back to supine.  Transfers                 General transfer comment: Not attempted due to low BP  Ambulation/Gait             General Gait Details: Not attempted due to low BP  Stairs            Wheelchair Mobility    Modified Rankin (Stroke Patients Only)       Balance Overall balance assessment: Needs assistance Sitting-balance support: Feet unsupported;Bilateral upper extremity  supported Sitting balance-Leahy Scale: Poor Sitting balance - Comments: Sat EOB x 3 minutes with min assist. Pt with BP 59/45 so returned to supine.                                     Pertinent Vitals/Pain Pain Assessment: Faces Faces Pain Scale: Hurts little more Pain Location: RUE Pain Descriptors / Indicators: Grimacing Pain Intervention(s): Limited activity within patient's tolerance;Monitored during session;Repositioned    Home Living Family/patient expects to be discharged to:: Private residence Living Arrangements: Children(son) Available Help at Discharge: Family;Available PRN/intermittently(son works part time per pt)             Additional Comments: Inconsistent reports of home set-up and need to clarify on future visits.     Prior Function Level of Independence: Needs assistance         Comments: Son lives with her and does house work per pt. Pt with inconsistent reports but states that she is independent with ADL. Unable to report if she uses RW.      Hand Dominance        Extremity/Trunk Assessment   Upper Extremity Assessment Upper Extremity Assessment: Defer to OT evaluation    Lower Extremity Assessment Lower Extremity Assessment: Difficult to assess due to impaired cognition;Generalized weakness       Communication   Communication: No difficulties  Cognition Arousal/Alertness: Awake/alert Behavior During Therapy: Restless Overall Cognitive Status: Impaired/Different from baseline Area of Impairment: Orientation;Attention;Memory;Following commands;Safety/judgement;Problem solving                 Orientation Level: Disoriented to;Place;Time;Situation Current Attention Level: Sustained Memory: Decreased recall of precautions;Decreased short-term memory Following Commands: Follows one step commands inconsistently Safety/Judgement: Decreased awareness of deficits;Decreased awareness of safety   Problem Solving:  Difficulty sequencing;Requires verbal cues;Requires tactile cues;Slow processing        General Comments      Exercises     Assessment/Plan    PT Assessment Patient needs continued PT services  PT Problem List Decreased strength;Decreased activity tolerance;Decreased mobility;Decreased balance;Decreased cognition;Pain       PT Treatment Interventions DME instruction;Gait training;Functional mobility training;Therapeutic activities;Therapeutic exercise;Balance training;Patient/family education;Cognitive remediation    PT Goals (Current goals can be found in the Care Plan section)  Acute Rehab PT Goals Patient Stated Goal: none stated PT Goal Formulation: Patient unable to participate in goal setting Time For Goal Achievement: 07/23/17 Potential to Achieve Goals: Fair    Frequency Min 3X/week   Barriers to discharge Decreased caregiver support May not have 24 hour care    Co-evaluation               AM-PAC PT "6 Clicks" Daily Activity  Outcome Measure Difficulty turning over in bed (including adjusting bedclothes, sheets and blankets)?: Unable Difficulty moving from lying on back to sitting on the side of the bed? : Unable Difficulty sitting down on and standing up from a chair with arms (e.g., wheelchair, bedside commode, etc,.)?: Unable Help needed moving to and from a bed to chair (including a wheelchair)?: Total Help needed walking in hospital room?: Total Help needed climbing 3-5 steps with a railing? : Total 6 Click Score: 6    End of Session Equipment Utilized During Treatment: Oxygen Activity Tolerance: Treatment limited secondary to medical complications (Comment)(low BP) Patient left: in bed;with call bell/phone within reach;with bed alarm set Nurse Communication: Mobility status PT Visit Diagnosis: Other abnormalities of gait and mobility (R26.89);Muscle weakness (generalized) (M62.81)    Time: [id number] PT Time Calculation (min) (ACUTE ONLY): 17  min   Charges:   PT Evaluation $PT Eval Moderate Complexity: 1 Mod     PT G CodesMarland Kitchen:        [facility] Shivangi Lutz PT [phone]   Angelina OkCary W [facility] 07/09/2017, 9:56 AM

## 2017-07-09 NOTE — Progress Notes (Signed)
Patient B/P 87/66. Notified the provider.

## 2017-07-09 NOTE — Progress Notes (Signed)
Family refuses NIV at this time. No distress noted.

## 2017-07-09 NOTE — Progress Notes (Signed)
[facility] TEAM 1 - Stepdown/ICU TEAM  Rhonda Moore  ZOX:[id number] DOB: April 24, 1926 DOA: 07/04/2017 PCP: [name]., MD    Brief Narrative:  84yo F w/ a Hx of dementia, thoracic aortic aneurysm, R iliac artery aneurysm, B carotid stenosis, HTN, HLD, CKD stage 3, Anemia, Thrombocytopenia, Hypothyroidism, COPD, Bronchiectasis, and cystic pancreatic lesions who presented w/ a cough w/ yellow green sputum for 2 weeks, leading to dyspnea and AMS on the day of her presentation.   In the ED a CXR noted a moderate size right pleural effusion with associated right basilar atelectasis.   Significant Events: 2/28 admit w/ dyspnea  3/1 TTE - EF 50-55% - hypokinesis of anteroseptal and inferoseptal myocardium - mild AoS w/ mod regurg - mod/severe tricuspid regurg - biatrial enlargement  3/2 R thoracentesis - 1L serous fluid   Subjective: The pt appears comfortable at the time of my visit.  She is in no acute resp distress.  She is sleeping soundly, and does not awaken to my exam.  I have allowed her to rest and have attempted not to disturb her.    Assessment & Plan:  Acute hypoxic respiratory failure w/ Sepsis due to LUL PNA (PCN allergy) Cont abx tx - wean O2 support as able - still requiring 6L HFNC - CXR 3/4 noted worsening of infiltrates in B LL, and increasing effusions - CXR today w/o signif improvement since 3/4  Acute worsening of chronic R Pleural Effusion  IR thoracentesis accomplished 3/2 - studies most c/w transudate - cultures bland thus far - review of records notes this effusion dates back at to Dec 2017 - exact etiology unclear, but recurrent nature is quite problematic   Chronic Afib Afib not listed in her med hx, but review of her old EKGs suggests this dates back as far as 2017 - not a candidate for anticoag given advanced age and frailty/high fall risk  - rate controlled at this time   Carotid stenosis  Possible L atrial appendage clot (per CT chest) Given advanced age and  signif dementia, w/ recent falls, she would not be an anticoag candidate regardless, therefore further w/u is not warranted - not appreciated on TTE   HTN > Hypotension  BP has been variable today, but with more sustained episodes of hypotension - I suspect she is in a state of decline from which she is not likely to recover   Mod Aortic regurgitation  Noted on TTE - M noted on exam   Mod//Severe Tricuspid regurgitation  Noted on TTE - M noted on exam   Sever Pulm HTN Noted on TTE - PA peak 72mm Hg  Hx Pancreatic cystic masses CT chest/abdomen/pelvis: essentially unchanged compared to prior exams   Transaminitis  Acute hepatitis panel negative - rapidly improving - likely due to shock liver as well as hepatic congestion due to severe Pulm HTN  Recent Labs  Lab 07/04/17 2028 07/05/17 0414 07/07/17 0834 07/08/17 0754  AST 1,193* 1,119* 209* 168*  ALT 608* 627* 310* 289*  ALKPHOS 67 53 74 53  BILITOT 2.7* 2.0* 1.4* 1.4*  PROT 6.1* 5.5* 5.1* 5.2*  ALBUMIN 3.0* 2.7* 2.4* 2.5*    AKI Hold ACEI/ARB - crt has normalized   Recent Labs  Lab 07/04/17 2028 07/05/17 0414 07/06/17 0436 07/07/17 0834 07/08/17 0754  CREATININE 1.29* 1.06* 1.03* 1.00 0.87    Anemia Hgb stable - no evidence of acute blood loss - B12 and folate normal - Fe low   Dementia  Hypothyroidism Cont Synthroid   DVT prophylaxis: lovenox  Code Status: DNR - NO CODE Family Communication: spoke w/ a close family friend at bedside   Disposition Plan: cont conservative care util 07/10/17 - if not marked improvement at that time, plan to transition to comfort focused care - at current rate of decline, pt may not survive beyond this hospital stay   Consultants:  none  Antimicrobials:  Levaquin 2/28 > Vanc 2/28 >   Objective: Blood pressure (!) 87/66, pulse 83, temperature 97.8 F (36.6 C), temperature source Oral, resp. rate (!) 30, height 5\' 6"  (1.676 m), weight 66.3 kg (146 lb 2.6 oz), SpO2 96  %.  Intake/Output Summary (Last 24 hours) at 07/09/2017 1648 Last data filed at 07/08/2017 1736 Gross per 24 hour  Intake 360 ml  Output 250 ml  Net 110 ml   Filed Weights   07/06/17 0558 07/07/17 0219 07/08/17 0353  Weight: 63.2 kg (139 lb 5.3 oz) 62.9 kg (138 lb 10.7 oz) 66.3 kg (146 lb 2.6 oz)    Examination: General: no evident distress/sleeping - on 6L HFNC Lungs: fine crackles th/o w/o wheezing - no air movement in bases Cardiovascular: irreg - rate controlled - 3/6 systolic M  Abdomen: ND, soft, bs+, no mass - thin  Extremities: No signif edema B LE   CBC: Recent Labs  Lab 07/04/17 2028 07/05/17 0414 07/06/17 0436 07/07/17 0834 07/08/17 0754  WBC 15.4* 15.7* 13.2* 20.2* 18.8*  NEUTROABS 12.3*  --   --   --   --   HGB 8.8* 9.3* 10.6* 10.5* 11.2*  HCT 29.5* 30.8* 36.2 34.9* 37.5  MCV 101.0* 100.3* 100.8* 100.6* 100.5*  PLT 124* 121* 155 146* 132*   Basic Metabolic Panel: Recent Labs  Lab 07/04/17 2028 07/05/17 0414 07/06/17 0436 07/07/17 0834 07/08/17 0754  NA 140 141 144 141 141  K 4.3 4.0 4.4 4.7 4.2  CL 103 107 109 110 110  CO2 23 24 24 22  21*  GLUCOSE 125* 134* 143* 143* 138*  BUN 39* 38* 42* 46* 40*  CREATININE 1.29* 1.06* 1.03* 1.00 0.87  CALCIUM 9.1 8.8* 9.2 8.9 9.0  MG  --   --  1.7 1.8 1.7   GFR: Estimated Creatinine Clearance: 41 mL/min (by C-G formula based on SCr of 0.87 mg/dL).  Liver Function Tests: Recent Labs  Lab 07/04/17 2028 07/05/17 0414 07/07/17 0834 07/08/17 0754  AST 1,193* 1,119* 209* 168*  ALT 608* 627* 310* 289*  ALKPHOS 67 53 74 53  BILITOT 2.7* 2.0* 1.4* 1.4*  PROT 6.1* 5.5* 5.1* 5.2*  ALBUMIN 3.0* 2.7* 2.4* 2.5*   Recent Labs  Lab 07/04/17 2311  LIPASE 22    Cardiac Enzymes: Recent Labs  Lab 07/05/17 0414 07/05/17 0813 07/05/17 1414  TROPONINI 0.03* 0.03* 0.03*    CBG: Recent Labs  Lab 07/04/17 2027  GLUCAP 140*    Recent Results (from the past 240 hour(s))  Blood Cultures (routine x 2)      Status: None   Collection Time: 07/04/17  8:40 PM  Result Value Ref Range Status   Specimen Description BLOOD SITE NOT SPECIFIED  Final   Special Requests IN PEDIATRIC BOTTLE Blood Culture adequate volume  Final   Culture   Final    NO GROWTH 5 DAYS Performed at [facility], [address] [place], [place] [address]    Report Status 07/09/2017 FINAL  Final  Urine culture     Status: None   Collection Time:  07/04/17  8:48 PM  Result Value Ref Range Status   Specimen Description URINE, CATHETERIZED  Final   Special Requests NONE  Final   Culture   Final    NO GROWTH Performed at [facility], [address] [place], [place] [address]    Report Status 07/06/2017 FINAL  Final  Blood Cultures (routine x 2)     Status: None   Collection Time: 07/04/17  9:15 PM  Result Value Ref Range Status   Specimen Description BLOOD RIGHT ANTECUBITAL  Final   Special Requests   Final    BOTTLES DRAWN AEROBIC AND ANAEROBIC Blood Culture adequate volume   Culture   Final    NO GROWTH 5 DAYS Performed at [facility], [address] [place], [place] [address]    Report Status 07/09/2017 FINAL  Final  Respiratory Panel by PCR     Status: None   Collection Time: 07/05/17 10:33 AM  Result Value Ref Range Status   Adenovirus NOT DETECTED NOT DETECTED Final   Coronavirus 229E NOT DETECTED NOT DETECTED Final   Coronavirus HKU1 NOT DETECTED NOT DETECTED Final   Coronavirus NL63 NOT DETECTED NOT DETECTED Final   Coronavirus OC43 NOT DETECTED NOT DETECTED Final   Metapneumovirus NOT DETECTED NOT DETECTED Final   Rhinovirus / Enterovirus NOT DETECTED NOT DETECTED Final   Influenza A NOT DETECTED NOT DETECTED Final   Influenza B NOT DETECTED NOT DETECTED Final   Parainfluenza Virus 1 NOT DETECTED NOT DETECTED Final   Parainfluenza Virus 2 NOT DETECTED NOT DETECTED Final   Parainfluenza Virus 3 NOT DETECTED NOT DETECTED Final   Parainfluenza Virus 4 NOT DETECTED NOT DETECTED Final    Respiratory Syncytial Virus NOT DETECTED NOT DETECTED Final   Bordetella pertussis NOT DETECTED NOT DETECTED Final   Chlamydophila pneumoniae NOT DETECTED NOT DETECTED Final   Mycoplasma pneumoniae NOT DETECTED NOT DETECTED Final  Culture, body fluid-bottle     Status: None (Preliminary result)   Collection Time: 07/06/17 11:14 AM  Result Value Ref Range Status   Specimen Description PLEURAL  Final   Special Requests NONE  Final   Culture   Final    NO GROWTH 3 DAYS Performed at [facility], [address] [place], [place] [address]    Report Status PENDING  Incomplete  Gram stain     Status: None   Collection Time: 07/06/17 11:14 AM  Result Value Ref Range Status   Specimen Description PLEURAL  Final   Special Requests NONE  Final   Gram Stain   Final    MODERATE WBC PRESENT, PREDOMINANTLY PMN NO ORGANISMS SEEN Performed at [facility], [address] [place], [place] [address]    Report Status 07/06/2017 FINAL  Final     Scheduled Meds: . aspirin EC  81 mg Oral Daily  . atenolol  12.5 mg Oral Daily  . citalopram  20 mg Oral Daily  . dextromethorphan-guaiFENesin  1 tablet Oral BID  . donepezil  5 mg Oral Q supper   Followed by  . [START ON 07/17/2017] donepezil  10 mg Oral Q supper  . enoxaparin (LOVENOX) injection  30 mg Subcutaneous Q24H  . ipratropium-albuterol  3 mL Nebulization BID  . levothyroxine  50 mcg Oral QAC breakfast  . mouth rinse  15 mL Mouth Rinse BID     LOS: 5 days   Lonia BloodJeffrey T. Jemell Town, MD [facility] Office  [phone] Pager - Text Page  per Amion as per below:  On-Call/Text Page:      [email or web address]      password [email or web address]  If 7PM-7AM, please contact night-coverage [email or web address] Password [facility] 07/09/2017, 4:48 PM

## 2017-07-10 DIAGNOSIS — A419 Sepsis, unspecified organism: Principal | ICD-10-CM

## 2017-07-10 DIAGNOSIS — J181 Lobar pneumonia, unspecified organism: Secondary | ICD-10-CM

## 2017-07-10 DIAGNOSIS — J90 Pleural effusion, not elsewhere classified: Secondary | ICD-10-CM

## 2017-07-10 DIAGNOSIS — J189 Pneumonia, unspecified organism: Secondary | ICD-10-CM

## 2017-07-10 LAB — CBC WITH DIFFERENTIAL/PLATELET
BASOS ABS: 0 10*3/uL (ref 0.0–0.1)
BASOS PCT: 0 %
EOS ABS: 0 10*3/uL (ref 0.0–0.7)
Eosinophils Relative: 0 %
HCT: 22.6 % — ABNORMAL LOW (ref 36.0–46.0)
HEMOGLOBIN: 6.8 g/dL — AB (ref 12.0–15.0)
Lymphocytes Relative: 9 %
Lymphs Abs: 1.6 10*3/uL (ref 0.7–4.0)
MCH: 29.3 pg (ref 26.0–34.0)
MCHC: 30.1 g/dL (ref 30.0–36.0)
MCV: 97.4 fL (ref 78.0–100.0)
MONOS PCT: 9 %
Monocytes Absolute: 1.5 10*3/uL — ABNORMAL HIGH (ref 0.1–1.0)
NEUTROS ABS: 14.3 10*3/uL — AB (ref 1.7–7.7)
NEUTROS PCT: 82 %
Platelets: 106 10*3/uL — ABNORMAL LOW (ref 150–400)
RBC: 2.32 MIL/uL — AB (ref 3.87–5.11)
RDW: 15.9 % — ABNORMAL HIGH (ref 11.5–15.5)
WBC: 17.4 10*3/uL — AB (ref 4.0–10.5)

## 2017-07-10 LAB — BASIC METABOLIC PANEL
ANION GAP: 7 (ref 5–15)
BUN: 29 mg/dL — ABNORMAL HIGH (ref 6–20)
CALCIUM: 8.3 mg/dL — AB (ref 8.9–10.3)
CO2: 26 mmol/L (ref 22–32)
Chloride: 105 mmol/L (ref 101–111)
Creatinine, Ser: 0.93 mg/dL (ref 0.44–1.00)
GFR, EST NON AFRICAN AMERICAN: 53 mL/min — AB (ref >60)
GLUCOSE: 123 mg/dL — AB (ref 65–99)
Potassium: 4.1 mmol/L (ref 3.5–5.1)
Sodium: 138 mmol/L (ref 135–145)

## 2017-07-10 LAB — MAGNESIUM: Magnesium: 1.6 mg/dL — ABNORMAL LOW (ref 1.7–2.4)

## 2017-07-10 MED ORDER — HALOPERIDOL 1 MG PO TABS
0.5000 mg | ORAL_TABLET | ORAL | Status: DC | PRN
  Filled 2017-07-10: qty 1

## 2017-07-10 MED ORDER — GLYCOPYRROLATE 0.2 MG/ML IJ SOLN: 0.2000 mg | INTRAMUSCULAR | Status: DC | PRN

## 2017-07-10 MED ORDER — SODIUM CHLORIDE 0.9 % IV BOLUS (SEPSIS)
500.0000 mL | Freq: Once | INTRAVENOUS | Status: AC
  Administered 2017-07-10: 500 mL via INTRAVENOUS

## 2017-07-10 MED ORDER — BISACODYL 10 MG RE SUPP: 10.0000 mg | Freq: Every day | RECTAL | Status: DC | PRN

## 2017-07-10 MED ORDER — ACETAMINOPHEN 650 MG RE SUPP: 650.0000 mg | Freq: Four times a day (QID) | RECTAL | Status: DC | PRN

## 2017-07-10 MED ORDER — LORAZEPAM 2 MG/ML PO CONC: 1.0000 mg | ORAL | Status: DC | PRN

## 2017-07-10 MED ORDER — MORPHINE SULFATE (PF) 4 MG/ML IV SOLN
1.0000 mg | INTRAVENOUS | Status: DC | PRN
  Administered 2017-07-10 – 2017-07-11 (×3): 1 mg via INTRAVENOUS
  Filled 2017-07-10 (×3): qty 1

## 2017-07-10 MED ORDER — GLYCOPYRROLATE 1 MG PO TABS
1.0000 mg | ORAL_TABLET | ORAL | Status: DC | PRN
  Filled 2017-07-10: qty 1

## 2017-07-10 MED ORDER — MORPHINE SULFATE (PF) 2 MG/ML IV SOLN: 1.0000 mg | INTRAVENOUS | Status: DC | PRN

## 2017-07-10 MED ORDER — HALOPERIDOL LACTATE 2 MG/ML PO CONC
0.5000 mg | ORAL | Status: DC | PRN
  Filled 2017-07-10: qty 0.3

## 2017-07-10 MED ORDER — METOPROLOL TARTRATE 5 MG/5ML IV SOLN: 2.5000 mg | Freq: Three times a day (TID) | INTRAVENOUS | Status: DC | PRN

## 2017-07-10 MED ORDER — LORAZEPAM 1 MG PO TABS: 1.0000 mg | ORAL_TABLET | ORAL | Status: DC | PRN

## 2017-07-10 MED ORDER — SENNA 8.6 MG PO TABS: 1.0000 | ORAL_TABLET | Freq: Every evening | ORAL | Status: DC | PRN

## 2017-07-10 MED ORDER — LORAZEPAM 2 MG/ML IJ SOLN
1.0000 mg | INTRAMUSCULAR | Status: DC | PRN
  Administered 2017-07-10 – 2017-07-11 (×3): 1 mg via INTRAVENOUS
  Filled 2017-07-10 (×3): qty 1

## 2017-07-10 MED ORDER — ACETAMINOPHEN 325 MG PO TABS: 650.0000 mg | ORAL_TABLET | Freq: Four times a day (QID) | ORAL | Status: DC | PRN

## 2017-07-10 MED ORDER — POLYVINYL ALCOHOL 1.4 % OP SOLN
1.0000 [drp] | Freq: Four times a day (QID) | OPHTHALMIC | Status: DC | PRN
  Filled 2017-07-10: qty 15

## 2017-07-10 MED ORDER — HALOPERIDOL LACTATE 5 MG/ML IJ SOLN: 0.5000 mg | INTRAMUSCULAR | Status: DC | PRN

## 2017-07-10 MED ORDER — BIOTENE DRY MOUTH MT LIQD: 15.0000 mL | OROMUCOSAL | Status: DC | PRN

## 2017-07-10 MED ORDER — ONDANSETRON HCL 4 MG/2ML IJ SOLN: 4.0000 mg | Freq: Four times a day (QID) | INTRAMUSCULAR | Status: DC | PRN

## 2017-07-10 MED ORDER — ONDANSETRON 4 MG PO TBDP
4.0000 mg | ORAL_TABLET | Freq: Four times a day (QID) | ORAL | Status: DC | PRN
  Filled 2017-07-10: qty 1

## 2017-07-10 NOTE — Progress Notes (Signed)
[facility] per provider orders.  Family spoke with provider and agreed to make patient comfort/hospice care. Called [facility] and gave report regarding patient and patient care. Transported patient via bed to [facility]. Family walked with patient and nurses to [facility].

## 2017-07-10 NOTE — Care Management Note (Signed)
Case Management Note  Patient Details  Name: Rhonda Moore MRN: [id number] Date of Birth: 11/22/1925  Subjective/Objective:    Acute resp failure, sespis d/t PNA, chronic worsening pleural effusion.                 Action/Plan: CSW following [facility].   Expected Discharge Date:                Expected Discharge Plan:  Hospice Medical Facility  In-House Referral:  Clinical Social Work  Discharge planning Services  CM Consult  Post Acute Care Choice:  NA Choice offered to:  NA  DME Arranged:  N/A DME Agency:  NA  HH Arranged:  NA HH Agency:  NA  Status of Service:  Completed, signed off  If discussed at Long Length of Stay Meetings, dates discussed:    Additional Comments:  Elliot CousinShavis, Elysia Grand Ellen, RN 07/10/2017, 2:03 PM

## 2017-07-10 NOTE — Progress Notes (Signed)
Clinical Social Worker was consulted by MD to assist patient in transitioning to a hospice facility. Per MD [facility] would like [facility]. CSW reached out to [place] Anne from [facility] and she stated at this time the facility does not have any bed available for today. [place] BaliMary Anne stated she will follow up with the family to do assessment. CSW spoke with the family at bedside to make them aware of [facility]. CSW to continue to follow patient and family for support.   Marrianne MoodAshley Glendell Fouse, MSW,  [employer] [phone]

## 2017-07-10 NOTE — Progress Notes (Signed)
[facility] Liaison:  RN visit  Received request from Stacie Acresshley, LCSW, for family interest in [facility].  Chart reviewed and spoke with Sallye OberLouise, daughter and Luther ParodySidney, son, to acknowledge referral.  Unfortunately, [facility] is not able to offer a room today.  Family and CSW are aware [facility] liaison will follow up with CSW and family tomorrow or sooner if room becomes available.  Please do not hesitate to call with questions.   Thank you for this referral.    Adele BarthelAmy Evans, RN, BSN [facility]ison [phone]  All hospital liaisons are on AMION.

## 2017-07-10 NOTE — Progress Notes (Signed)
1825 Received pt from 4E, lethargic but responsive to verbal stimuli. Pt verbalized that she is comfortable as of now. Family at the bedside.

## 2017-07-10 NOTE — Progress Notes (Signed)
PROGRESS NOTE    Rhonda Moore  ZOX:[id number] DOB: [birth year] DOA: 07/04/2017 PCP: [name]., MD   Brief Narrative:  84 y.o. WF PMHx Cardiomegaly,Thoracic aortic aneurysm,RIGHT iliac artery aneurysm,  Bilateral carotid stenosis,HTN, HLD, CKD stage3, Anemia,Thrombocytopenia Hypothyroidism,  COPD, Bronchiectasis, pancreatic cystic lesion.  Apparently has had cough w yellow green sputum for the past 2 weeks.  Pt has dementia and is unable to provide history.  This is given by her family.  Family states that she was dyspneic today as well and had decrease in responsiveness.  Family states pt has not had fever, chills, cp, palp, n/v, diarrhea, brbpr.   Pt was brought to ED for evaluation.    In ED,  IMPRESSION: Worsening moderate size right pleural effusion likely with associated right basilar atelectasis. Stable cardiomegaly with mild vascular congestion.    Subjective: 3/6  Difficult to arouse. Complains bilateral hip pain when awake. Negative CP, positive SOB. Her family members have decided on comfort care. Interested in [facility] if they have a bed available   Assessment & Plan:   Principal Problem:   Hypoxia Active Problems:   Renal insufficiency   Anemia   Pleural effusion   Thrombocytopenia (HCC)   Dementia   DNR (do not resuscitate)   Acute respiratory failure with hypoxia/Sepsis/CAP -discontinue antibiotics. Patient now comfort care -DuoNeb -Mucinex DM -Solu-Medrol 80 mg daily -Physiotherapy vest BID -Titrate O2 to maintain SPO2> 93%  RIGHT Pleural Effusion  -Infectious vs CHF. Review of EMR: Appears Effusion dates back to December 2017 unclear etiology, but recurrent nature is quite problematic -3/2 S/P IR Thoracentesis -Thoracentesis labs consistent with transudate  Dilated cardiomyopathy -Strict in and out since admission +3.2 L -discontinue Daily weight  -discontinue all scheduled cardiac medication. -metoprolol IV 2.5 mg TID PRN HR> 105 for  comfort  Pulmonary hypertension -See cardiomyopathy  Carotid stenosis  Essential HTN See cardiomyopathy  Abnormal liver function/Hx Pancreatic cyst mass -Acute Hepatitis panel negative -CT chest/abdomen/pelvis: Essentially unchanged see results below  A KI Recent Labs  Lab 07/04/17 2028 07/05/17 0414 07/06/17 0436 07/07/17 0834 07/08/17 0754  CREATININE 1.29* 1.06* 1.03* 1.00 0.87  -Hold ACEI/ARB -Hold nephrotoxic medication  Anemia -Anemia panel pending  Dementia -Unknown baseline  Hypothyroidism -discontinue Synthroid   Goals of care -Patient's family has agreed to comfort care. Would like all unnecessary blood draw/procedures discontinued. Interested in [facility].      DVT prophylaxis: Lovenox Code Status: DO NOT RESUSCITATE Family Communication: None Disposition Plan: TBD   Consultants:  IR   Procedures/Significant Events:  2/28 CT head W contrast:No definite CT evidence for acute intracranial abnormality. 2. Atrophy and small vessel ischemic changes of the white matter. 3/1 CT abdomen pelvis W contrast:Pneumonia on the left, most extensive in the upper lobe. 2. Volume overload with anasarca and pleural effusions. Right pleural effusion is large and cuases right middle and lower lobe collapse. The left pleural effusion is small. 3. Non filling of the left atrial appendage which may be related to contrast timing or thrombus. The appendage did opacify in 2018. Please correlate for atrial fibrillation. 4. Fusiform ascending aortic aneurysm measuring up to 4.8 cm, unchanged from 2018. 5. Multiple cystic pancreatic masses without worrisome growth since 2018. 3/1 CT chest W contrast:-Pneumonia on the left, most extensive in the upper lobe. - Right pleural effusion is large and cuases right middle and lower lobe collapse. The left pleural effusion is small. -Non filling of the left atrial appendage which may be related  to contrast timing or  thrombus. The appendage did opacify in 2018. Please correlate for atrial fibrillation. - Fusiform ascending aortic aneurysm measuring up to 4.8 cm, unchanged from 2018. - Multiple cystic pancreatic masses without worrisome growth since 2018 3/1 Echocardiogram; LVEF=.50%-55%. Hypokinesis of the anteroseptal and inferoseptal   myocardium. - Aortic valve: moderate regurgitation.  -- Left atrium: severely dilated. - Right atrium: severely dilated. - Tricuspid valve: moderate-severe regurgitation. - Pulmonary arteries: PA  peak pressure: 72 mm Hg (S). 3/2 IR Thoracentesis: Aspirated 1 L fluid.     I have personally reviewed and interpreted all radiology studies and my findings are as above.  VENTILATOR SETTINGS:    Cultures   Antimicrobials: Anti-infectives (From admission, onward)   Start     Stop   07/06/17 2200  levofloxacin (LEVAQUIN) IVPB 750 mg         07/06/17 0600  vancomycin (VANCOCIN) 500 mg in sodium chloride 0.9 % 100 mL IVPB         07/05/17 0130  vancomycin (VANCOCIN) IVPB 1000 mg/200 mL premix     07/05/17 0248   07/04/17 2100  levofloxacin (LEVAQUIN) IVPB 750 mg     07/04/17 2252       Devices    LINES / TUBES:      Continuous Infusions: . levofloxacin (LEVAQUIN) IV Stopped (07/08/17 2335)  . vancomycin Stopped (07/09/17 0953)     Objective: Vitals:   07/09/17 2326 07/10/17 0000 07/10/17 0017 07/10/17 0546  BP: (!) 85/59 (!) 74/53 (!) 79/60 (!) 92/56  Pulse: (!) 113 94  92  Resp: (!) 24 19  (!) 21  Temp:  (!) 97.3 F (36.3 C)  98 F (36.7 C)  TempSrc:  Oral  Oral  SpO2: 92% 100%  100%  Weight:      Height:        Intake/Output Summary (Last 24 hours) at 07/10/2017 0802 Last data filed at 07/10/2017 0018 Gross per 24 hour  Intake -  Output 700 ml  Net -700 ml   Filed Weights   07/06/17 0558 07/07/17 0219 07/08/17 0353  Weight: 139 lb 5.3 oz (63.2 kg) 138 lb 10.7 oz (62.9 kg) 146 lb 2.6 oz (66.3 kg)    Physical Exam:  General:  somnolent, difficult to arouse positive acute respiratory distress Neck:  Negative scars, masses, torticollis, lymphadenopathy, JVD Lungs: Clear to auscultation bilaterally without wheezes or crackles Cardiovascular: tachycardia,Regular rhythm without murmur gallop or rub normal S1 and S2 Abdomen: negative abdominal pain, nondistended, positive soft, bowel sounds, no rebound, no ascites, no appreciable mass Extremities: positive bilateral hip pain to movement and palpation. negative edema bilateral lower extremities Skin: Negative rashes, lesions, ulcers Psychiatric:  Unable to fully assess secondary to somnolence. Central nervous system:  Cranial nerves II through XII intact, tongue/uvula midline. Follow some commands moves all extremities spontaneously  .     Data Reviewed: Care during the described time interval was provided by me .  I have reviewed this patient's available data, including medical history, events of note, physical examination, and all test results as part of my evaluation.   CBC: Recent Labs  Lab 07/04/17 2028 07/05/17 0414 07/06/17 0436 07/07/17 0834 07/08/17 0754  WBC 15.4* 15.7* 13.2* 20.2* 18.8*  NEUTROABS 12.3*  --   --   --   --   HGB 8.8* 9.3* 10.6* 10.5* 11.2*  HCT 29.5* 30.8* 36.2 34.9* 37.5  MCV 101.0* 100.3* 100.8* 100.6* 100.5*  PLT 124* 121* 155 146* 132*  Basic Metabolic Panel: Recent Labs  Lab 07/04/17 2028 07/05/17 0414 07/06/17 0436 07/07/17 0834 07/08/17 0754  NA 140 141 144 141 141  K 4.3 4.0 4.4 4.7 4.2  CL 103 107 109 110 110  CO2 23 24 24 22  21*  GLUCOSE 125* 134* 143* 143* 138*  BUN 39* 38* 42* 46* 40*  CREATININE 1.29* 1.06* 1.03* 1.00 0.87  CALCIUM 9.1 8.8* 9.2 8.9 9.0  MG  --   --  1.7 1.8 1.7   GFR: Estimated Creatinine Clearance: 41 mL/min (by C-G formula based on SCr of 0.87 mg/dL). Liver Function Tests: Recent Labs  Lab 07/04/17 2028 07/05/17 0414 07/07/17 0834 07/08/17 0754  AST 1,193* 1,119* 209* 168*  ALT  608* 627* 310* 289*  ALKPHOS 67 53 74 53  BILITOT 2.7* 2.0* 1.4* 1.4*  PROT 6.1* 5.5* 5.1* 5.2*  ALBUMIN 3.0* 2.7* 2.4* 2.5*   Recent Labs  Lab 07/04/17 2311  LIPASE 22   No results for input(s): AMMONIA in the last 168 hours. Coagulation Profile: No results for input(s): INR, PROTIME in the last 168 hours. Cardiac Enzymes: Recent Labs  Lab 07/05/17 0414 07/05/17 0813 07/05/17 1414  TROPONINI 0.03* 0.03* 0.03*   BNP (last 3 results) No results for input(s): PROBNP in the last 8760 hours. HbA1C: No results for input(s): HGBA1C in the last 72 hours. CBG: Recent Labs  Lab 07/04/17 2027  GLUCAP 140*   Lipid Profile: No results for input(s): CHOL, HDL, LDLCALC, TRIG, CHOLHDL, LDLDIRECT in the last 72 hours. Thyroid Function Tests: No results for input(s): TSH, T4TOTAL, FREET4, T3FREE, THYROIDAB in the last 72 hours. Anemia Panel: No results for input(s): VITAMINB12, FOLATE, FERRITIN, TIBC, IRON, RETICCTPCT in the last 72 hours. Urine analysis:    Component Value Date/Time   COLORURINE AMBER (A) 07/04/2017 2043   APPEARANCEUR HAZY (A) 07/04/2017 2043   LABSPEC 1.018 07/04/2017 2043   PHURINE 5.0 07/04/2017 2043   GLUCOSEU NEGATIVE 07/04/2017 2043   HGBUR NEGATIVE 07/04/2017 2043   BILIRUBINUR NEGATIVE 07/04/2017 2043   KETONESUR NEGATIVE 07/04/2017 2043   PROTEINUR 100 (A) 07/04/2017 2043   NITRITE NEGATIVE 07/04/2017 2043   LEUKOCYTESUR NEGATIVE 07/04/2017 2043   Sepsis Labs: @LABRCNTIP (procalcitonin:4,lacticidven:4)  ) Recent Results (from the past 240 hour(s))  Blood Cultures (routine x 2)     Status: None   Collection Time: 07/04/17  8:40 PM  Result Value Ref Range Status   Specimen Description BLOOD SITE NOT SPECIFIED  Final   Special Requests IN PEDIATRIC BOTTLE Blood Culture adequate volume  Final   Culture   Final    NO GROWTH 5 DAYS Performed at [facility], [address], [place], [place] [address]    Report Status 07/09/2017 FINAL  Final   Urine culture     Status: None   Collection Time: 07/04/17  8:48 PM  Result Value Ref Range Status   Specimen Description URINE, CATHETERIZED  Final   Special Requests NONE  Final   Culture   Final    NO GROWTH Performed at [facility], [address] [place], [place] [address]    Report Status 07/06/2017 FINAL  Final  Blood Cultures (routine x 2)     Status: None   Collection Time: 07/04/17  9:15 PM  Result Value Ref Range Status   Specimen Description BLOOD RIGHT ANTECUBITAL  Final   Special Requests   Final    BOTTLES DRAWN AEROBIC AND ANAEROBIC Blood Culture adequate volume   Culture  Final    NO GROWTH 5 DAYS Performed at [facility], [address], [place], [place] [address]    Report Status 07/09/2017 FINAL  Final  Respiratory Panel by PCR     Status: None   Collection Time: 07/05/17 10:33 AM  Result Value Ref Range Status   Adenovirus NOT DETECTED NOT DETECTED Final   Coronavirus 229E NOT DETECTED NOT DETECTED Final   Coronavirus HKU1 NOT DETECTED NOT DETECTED Final   Coronavirus NL63 NOT DETECTED NOT DETECTED Final   Coronavirus OC43 NOT DETECTED NOT DETECTED Final   Metapneumovirus NOT DETECTED NOT DETECTED Final   Rhinovirus / Enterovirus NOT DETECTED NOT DETECTED Final   Influenza A NOT DETECTED NOT DETECTED Final   Influenza B NOT DETECTED NOT DETECTED Final   Parainfluenza Virus 1 NOT DETECTED NOT DETECTED Final   Parainfluenza Virus 2 NOT DETECTED NOT DETECTED Final   Parainfluenza Virus 3 NOT DETECTED NOT DETECTED Final   Parainfluenza Virus 4 NOT DETECTED NOT DETECTED Final   Respiratory Syncytial Virus NOT DETECTED NOT DETECTED Final   Bordetella pertussis NOT DETECTED NOT DETECTED Final   Chlamydophila pneumoniae NOT DETECTED NOT DETECTED Final   Mycoplasma pneumoniae NOT DETECTED NOT DETECTED Final  Culture, body fluid-bottle     Status: None (Preliminary result)   Collection Time: 07/06/17 11:14 AM  Result Value Ref Range Status    Specimen Description PLEURAL  Final   Special Requests NONE  Final   Culture   Final    NO GROWTH 3 DAYS Performed at [facility], [address] [place], [place] [address]    Report Status PENDING  Incomplete  Gram stain     Status: None   Collection Time: 07/06/17 11:14 AM  Result Value Ref Range Status   Specimen Description PLEURAL  Final   Special Requests NONE  Final   Gram Stain   Final    MODERATE WBC PRESENT, PREDOMINANTLY PMN NO ORGANISMS SEEN Performed at [facility], [address], [place], [place] [address]    Report Status 07/06/2017 FINAL  Final         Radiology Studies: Dg Chest Port 1 View  Result Date: 07/09/2017 CLINICAL DATA:  Pleural effusion.  Bronchitis. EXAM: PORTABLE CHEST 1 VIEW COMPARISON:  07/08/2017 FINDINGS: Cardiomegaly again demonstrated. Persistent bilateral effusions. Persistent abnormal density in the lower lungs that could be volume loss or pneumonia. No worsening or new finding. IMPRESSION: No change. Cardiomegaly. Pleural effusions. Lower lobe volume loss and/or pneumonia. Electronically Signed   By: Paulina Fusi M.D.   On: 07/09/2017 07:51        Scheduled Meds: . aspirin EC  81 mg Oral Daily  . atenolol  12.5 mg Oral Daily  . citalopram  20 mg Oral Daily  . dextromethorphan-guaiFENesin  1 tablet Oral BID  . donepezil  5 mg Oral Q supper   Followed by  . [START ON 07/17/2017] donepezil  10 mg Oral Q supper  . enoxaparin (LOVENOX) injection  30 mg Subcutaneous Q24H  . ipratropium-albuterol  3 mL Nebulization BID  . levothyroxine  50 mcg Oral QAC breakfast  . mouth rinse  15 mL Mouth Rinse BID   Continuous Infusions: . levofloxacin (LEVAQUIN) IV Stopped (07/08/17 2335)  . vancomycin Stopped (07/09/17 0953)     LOS: 6 days    Time spent: 40 minutes    Denali Sharma, Roselind Messier, MD [facility] Pager [phone]   If 7PM-7AM, please contact night-coverage [email or web address]  Password [email or web address] 07/10/2017, 8:02 AM

## 2017-07-10 NOTE — Progress Notes (Signed)
CRITICAL VALUE ALERT  Critical Value:  Hgb 6.8   Date & Time Notied:  07/10/2017  Provider Notified: Dr. Joseph ArtWoods  Orders Received/Actions taken: Awaiting response

## 2017-07-10 NOTE — Progress Notes (Signed)
Pt bp 79/60 post bolus. Paged on call provider. New orders given. Will continue to follow.

## 2017-07-11 LAB — CULTURE, BODY FLUID W GRAM STAIN -BOTTLE

## 2017-07-11 LAB — CULTURE, BODY FLUID-BOTTLE: CULTURE: NO GROWTH

## 2017-07-11 MED ORDER — POLYVINYL ALCOHOL 1.4 % OP SOLN: 1.0000 [drp] | Freq: Four times a day (QID) | OPHTHALMIC | 0 refills | Status: AC | PRN

## 2017-07-11 MED ORDER — DM-GUAIFENESIN ER 30-600 MG PO TB12: 1.0000 | ORAL_TABLET | Freq: Two times a day (BID) | ORAL | 0 refills | Status: AC

## 2017-07-11 MED ORDER — LORAZEPAM 2 MG/ML IJ SOLN: 1.0000 mg | INTRAMUSCULAR | 0 refills | Status: AC | PRN

## 2017-07-11 MED ORDER — HALOPERIDOL LACTATE 5 MG/ML IJ SOLN: 0.5000 mg | INTRAMUSCULAR | 0 refills | Status: AC | PRN

## 2017-07-11 MED ORDER — ONDANSETRON HCL 4 MG/2ML IJ SOLN: 4.0000 mg | Freq: Four times a day (QID) | INTRAMUSCULAR | 0 refills | Status: AC | PRN

## 2017-07-11 MED ORDER — ACETAMINOPHEN 160 MG/5ML PO SOLN: 650.0000 mg | Freq: Four times a day (QID) | ORAL | 0 refills | Status: AC | PRN

## 2017-07-11 MED ORDER — MORPHINE SULFATE (PF) 4 MG/ML IV SOLN: 1.0000 mg | INTRAVENOUS | 0 refills | Status: AC | PRN

## 2017-07-11 MED ORDER — GLYCOPYRROLATE 0.2 MG/ML IJ SOLN: 0.2000 mg | INTRAMUSCULAR | 0 refills | Status: AC | PRN

## 2017-07-11 MED ORDER — LORAZEPAM 1 MG PO TABS: 1.0000 mg | ORAL_TABLET | ORAL | 0 refills | Status: AC | PRN

## 2017-07-11 MED ORDER — HALOPERIDOL LACTATE 2 MG/ML PO CONC: 0.6000 mg | ORAL | 0 refills | Status: AC | PRN

## 2017-07-11 MED ORDER — ACETAMINOPHEN 650 MG RE SUPP: 650.0000 mg | Freq: Four times a day (QID) | RECTAL | 0 refills | Status: AC | PRN

## 2017-07-11 MED ORDER — ALBUTEROL SULFATE (2.5 MG/3ML) 0.083% IN NEBU: 2.5000 mg | INHALATION_SOLUTION | RESPIRATORY_TRACT | 0 refills | Status: AC | PRN

## 2017-07-11 MED ORDER — ONDANSETRON 4 MG PO TBDP: 4.0000 mg | ORAL_TABLET | Freq: Four times a day (QID) | ORAL | 0 refills | Status: AC | PRN

## 2017-07-11 MED ORDER — HALOPERIDOL 0.5 MG PO TABS: 0.5000 mg | ORAL_TABLET | ORAL | 0 refills | Status: AC | PRN

## 2017-07-11 MED ORDER — LORAZEPAM 2 MG/ML PO CONC: 1.0000 mg | ORAL | 0 refills | Status: AC | PRN

## 2017-07-11 MED ORDER — GLYCOPYRROLATE 1 MG PO TABS: 1.0000 mg | ORAL_TABLET | ORAL | 0 refills | Status: AC | PRN

## 2017-07-11 NOTE — Progress Notes (Signed)
Nutrition Brief Note  Chart reviewed. Pt now transitioning to comfort care.  No further nutrition interventions warranted at this time.  Please re-consult as needed.   Cardarius Senat A. Alban Marucci, RD, LDN, CDE Pager: [phone] After hours Pager: [phone]  

## 2017-07-11 NOTE — Progress Notes (Signed)
[facility], All paperwork and scripts given to transporters. All belonings sent with son. IV left in per hospice nurse request.

## 2017-07-11 NOTE — Clinical Social Work Note (Signed)
[facility] Per Cherie, liaison with [facility], patient could d/c with discharge order by MD and she will print discharge summary once done. Nurse Ginger [phone]) was advised that patient would leave with d/c order and facility would get d/c summary later once done by MD.   Patient's readiness for discharge communicated with Dr. Joseph ArtWoods, who had a meeting this afternoon and agreed to do d/c order and then d/c summary once out of his meeting. Family at the bedside through patient's discharge. CSW signing off as patient has discharged.  Genelle BalVanessa Beata Beason, MSW, LCSW Licensed Clinical Social Worker Clinical Social Work Department [facility] [phone]

## 2017-07-11 NOTE — Progress Notes (Signed)
[facility] Lia[facility]:  RN visit  Received request from Stacie Acresshley, LCSW, for [facility].  Chart reviewed and spoke with Sallye OberLouise, daughter and Luther ParodySidney, son, to acknowledge referral on 07/10/17.  [facility].  Family, Ashley,CSW, and Erie NoeVanessa, CSW,  are aware [facility] liaison will follow up with CSW and family tomorrow or sooner if room becomes available.  Please do not hesitate to call with questions.   Thank you for this referral.    Adele BarthelAmy Evans, RN, BSN [facility]iaison [phone]  All hospital liaisons are on AMION.

## 2017-07-11 NOTE — Progress Notes (Signed)
[facility]:   Met with son after getting call from Lawrence MSW.  Discussed pt's decline and her illness. The pt is moaning this visit with pain at times. The son states that he and his sister both are hoping to move the pt to the [facility] today if bed available. Discussed hospice care hospice philosophy and there goals. They do agree to comfort care. Pt was approved by Dr,. Baruch at [facility] to come. Bed was offered and son accepted with daughter having written a note stating ok for him to sign consents. Lorriane Shire notified of the bed offered and acceptance. She will contact the MD and arrange transport for pickup. Webb Silversmith RN [phone]

## 2017-07-11 NOTE — Discharge Summary (Signed)
Physician Discharge Summary  Rhonda Moore:[id number] DOB: February 07, 1926 DOA: 07/04/2017  PCP: [name]., MD  Admit date: 07/04/2017 Discharge date: 07/11/2017  Time spent: 35 minutes  Recommendations for Outpatient Follow-up:    Acute respiratory failure with hypoxia/Sepsis/CAP -discontinue antibiotics. Patient now comfort care -Titrate O2 to maintain SPO2> 93%   RIGHT Pleural Effusion  -Infectious vs CHF. Review of EMR: Appears Effusion dates back to December 2017 unclear etiology, but recurrent nature is quite problematic -3/2 S/P IR Thoracentesis -Thoracentesis labs consistent with transudate -Patient now comfort care. Discharged to [facility]   Dilated cardiomyopathy -Strict in and out since admission +3.2 L -discontinue Daily weight  -discontinue all scheduled cardiac medication. -Patient now comfort care. Discharged to [facility]   Pulmonary hypertension -See cardiomyopathy   Carotid stenosis   Essential HTN See cardiomyopathy   Abnormal liver function/Hx Pancreatic cyst mass -Acute Hepatitis panel negative -CT chest/abdomen/pelvis: Essentially unchanged see results below -Patient now comfort care. Discharged to [facility]   A KI Recent Labs  Lab 07/04/17 2028 07/05/17 0414 07/06/17 0436 07/07/17 0834 07/08/17 0754 07/10/17 0907  CREATININE 1.29* 1.06* 1.03* 1.00 0.87 0.93  -Patient now comfort care. Discharged to [name] -Hold ACEI/ARB   Anemia -Patient now comfort care. Discharged to [name]   Dementia -Unknown baseline   Hypothyroidism -discontinue Synthroid  -Patient now comfort care. Discharged to [name]   Discharge Diagnoses:  Principal Problem:   Hypoxia Active Problems:   Renal insufficiency   Anemia   Pleural effusion   Thrombocytopenia (HCC)   Dementia   DNR (do not resuscitate)   Discharge Condition: Guarded  Diet recommendation: Regular diet fluid consistency  thin.  Filed Weights   07/06/17 0558 07/07/17 0219 07/08/17 0353  Weight: 139 lb 5.3 oz (63.2 kg) 138 lb 10.7 oz (62.9 kg) 146 lb 2.6 oz (66.3 kg)    History of present illness:  84 y.o. WF PMHx Cardiomegaly,Thoracic aortic aneurysm,RIGHT iliac artery aneurysm,  Bilateral carotid stenosis,HTN, HLD, CKD stage3, Anemia,Thrombocytopenia Hypothyroidism,  COPD, Bronchiectasis, pancreatic cystic lesion.   Apparently has had cough w yellow green sputum for the past 2 weeks.  Pt has dementia and is unable to provide history.  This is given by her family.  Family states that she was dyspneic today as well and had decrease in responsiveness.  Family states pt has not had fever, chills, cp, palp, n/v, diarrhea, brbpr.   Pt was brought to ED for evaluation.  During his last pulsation patient initially treated for acute respiratory failure with hypoxia secondary to sepsis pneumonia. Patient course, complicated by decompensated cardiomyopathy. After initially treat patient aggressively with appropriate antibiotics and cardiac medication and seeing only minimal improvement family decided to make patient comfort care. Eventually decided on inpatient hospice   Procedures: 2/28 CT head W contrast:-No definite CT evidence for acute intracranial abnormality. 2. Atrophy and small vessel ischemic changes of the white matter. 3/1 CT abdomen pelvis W contrast:Pneumonia on the left, most extensive in the upper lobe. 2. Volume overload with anasarca and pleural effusions. Right pleural effusion is large and cuases right middle and lower lobe collapse. The left pleural effusion is small. 3. Non filling of the left atrial appendage which may be related to contrast timing or thrombus. The appendage did opacify in 2018. Please correlate for atrial fibrillation. 4. Fusiform ascending aortic aneurysm measuring up to 4.8 cm, unchanged from 2018. 5. Multiple cystic pancreatic masses without worrisome growth  since 2018. 3/1 CT chest W contrast:-Pneumonia on the left, most extensive in the upper lobe. - Right pleural effusion is large and cuases right middle and lower lobe collapse. The left pleural effusion is small. -Non filling of the left atrial appendage which may be related to contrast timing or thrombus. The appendage did opacify in 2018. Please correlate for atrial fibrillation. - Fusiform ascending aortic aneurysm measuring up to 4.8 cm, unchanged from 2018. - Multiple cystic pancreatic masses without worrisome growth since 2018 3/1 Echocardiogram; LVEF=.50%-55%. Hypokinesis of the anteroseptal and inferoseptal   myocardium. - Aortic valve: moderate regurgitation.  -- Left atrium: severely dilated. - Right atrium: severely dilated. - Tricuspid valve: moderate-severe regurgitation. - Pulmonary arteries: PA  peak pressure: 72 mm Hg (S). 3/2 IR Thoracentesis: Aspirated 1 L fluid.   Consultations: IR   Antibiotics Anti-infectives (From admission, onward)   Start     Stop   07/09/17 0800  vancomycin (VANCOCIN) IVPB 750 mg/150 ml premix  Status:  Discontinued     07/10/17 1350   07/06/17 2200  levofloxacin (LEVAQUIN) IVPB 750 mg  Status:  Discontinued     07/10/17 1350   07/06/17 0600  vancomycin (VANCOCIN) 500 mg in sodium chloride 0.9 % 100 mL IVPB  Status:  Discontinued     07/09/17 0731   07/05/17 0130  vancomycin (VANCOCIN) IVPB 1000 mg/200 mL premix     07/05/17 0248   07/04/17 2100  levofloxacin (LEVAQUIN) IVPB 750 mg     07/04/17 2252       Discharge Exam: Vitals:   07/10/17 0820 07/10/17 1119 07/10/17 1901 07/11/17 0628  BP: (!) 89/64 (!) 85/63 100/83 92/66  Pulse: 97 87 89   Resp: (!) 24 (!) 24 (!) 21   Temp: (!) 97.5 F (36.4 C) 97.6 F (36.4 C) 97.7 F (36.5 C)   TempSrc: Axillary Axillary Oral   SpO2: 100% 100% 100%   Weight:      Height:        General: -Patient now comfort care. Discharged to [facility]    Discharge  Instructions   Allergies as of 07/11/2017      Reactions   Hydrocodone Other (See Comments)   Unknown reaction   Penicillins Other (See Comments)   Unknown reaction Has patient had a PCN reaction causing immediate rash, facial/tongue/throat swelling, SOB or lightheadedness with hypotension: unknown Has patient had a PCN reaction causing severe rash involving mucus membranes or skin necrosis: unknown Has patient had a PCN reaction that required hospitalization unknown Has patient had a PCN reaction occurring within the last 10 years: no If all of the above answers are "NO", then may proceed with Cephalosporin use.      Medication List    STOP taking these medications   amLODipine 5 MG tablet Commonly known as:  NORVASC   aspirin 81 MG tablet   atenolol 50 MG tablet Commonly known as:  TENORMIN   budesonide-formoterol 160-4.5 MCG/ACT inhaler Commonly known as:  SYMBICORT   citalopram 20 MG tablet Commonly known as:  CELEXA   donepezil 10 MG tablet Commonly known as:  ARICEPT   ELDERTONIC PO   ibuprofen 200 MG tablet Commonly known as:  ADVIL,MOTRIN   levothyroxine 50 MCG tablet Commonly known as:  SYNTHROID, LEVOTHROID   losartan 50 MG tablet Commonly known as:  COZAAR   potassium chloride 10 MEQ tablet Commonly known as:  K-DUR,KLOR-CON   traMADol 50 MG tablet Commonly known as:  Janean Sark  VITAMIN D-400 PO     TAKE these medications   acetaminophen 160 MG/5ML solution Commonly known as:  TYLENOL Take 20.3 mLs (650 mg total) by mouth every 6 (six) hours as needed for mild pain or moderate pain.   acetaminophen 650 MG suppository Commonly known as:  TYLENOL Place 1 suppository (650 mg total) rectally every 6 (six) hours as needed for mild pain (or Fever >/= 101).   albuterol (2.5 MG/3ML) 0.083% nebulizer solution Commonly known as:  PROVENTIL Take 3 mLs (2.5 mg total) by nebulization every 2 (two) hours as needed for wheezing.    dextromethorphan-guaiFENesin 30-600 MG 12hr tablet Commonly known as:  MUCINEX DM Take 1 tablet by mouth 2 (two) times daily.   fluticasone 50 MCG/ACT nasal spray Commonly known as:  FLONASE Place 1 spray into both nostrils daily as needed for allergies or rhinitis.   glycopyrrolate 1 MG tablet Commonly known as:  ROBINUL Take 1 tablet (1 mg total) by mouth every 4 (four) hours as needed (excessive secretions).   glycopyrrolate 0.2 MG/ML injection Commonly known as:  ROBINUL Inject 1 mL (0.2 mg total) into the skin every 4 (four) hours as needed (excessive secretions).   glycopyrrolate 0.2 MG/ML injection Commonly known as:  ROBINUL Inject 1 mL (0.2 mg total) into the vein every 4 (four) hours as needed (excessive secretions).   haloperidol 0.5 MG tablet Commonly known as:  HALDOL Take 1 tablet (0.5 mg total) by mouth every 4 (four) hours as needed for agitation (or delirium).   haloperidol 2 MG/ML solution Commonly known as:  HALDOL Place 0.3 mLs (0.6 mg total) under the tongue every 4 (four) hours as needed for agitation (or delirium).   haloperidol lactate 5 MG/ML injection Commonly known as:  HALDOL Inject 0.1 mLs (0.5 mg total) into the vein every 4 (four) hours as needed (or delirium).   ipratropium 0.06 % nasal spray Commonly known as:  ATROVENT 1-2 sprays each nostril, up to 4 times daily as needed What changed:    how much to take  how to take this  when to take this  reasons to take this  additional instructions   LORazepam 1 MG tablet Commonly known as:  ATIVAN Take 1 tablet (1 mg total) by mouth every 4 (four) hours as needed for anxiety.   LORazepam 2 MG/ML concentrated solution Commonly known as:  ATIVAN Place 0.5 mLs (1 mg total) under the tongue every 4 (four) hours as needed for anxiety.   LORazepam 2 MG/ML injection Commonly known as:  ATIVAN Inject 0.5 mLs (1 mg total) into the vein every 4 (four) hours as needed for anxiety.   morphine  4 MG/ML injection Inject 0.25 mLs (1 mg total) into the vein every 3 (three) hours as needed for moderate pain or severe pain.   ondansetron 4 MG disintegrating tablet Commonly known as:  ZOFRAN-ODT Take 1 tablet (4 mg total) by mouth every 6 (six) hours as needed for nausea.   ondansetron 4 MG/2ML Soln injection Commonly known as:  ZOFRAN Inject 2 mLs (4 mg total) into the vein every 6 (six) hours as needed for nausea.   polyvinyl alcohol 1.4 % ophthalmic solution Commonly known as:  LIQUIFILM TEARS Place 1 drop into both eyes 4 (four) times daily as needed for dry eyes.      Allergies  Allergen Reactions  . Hydrocodone Other (See Comments)    Unknown reaction  . Penicillins Other (See Comments)    Unknown reaction  Has patient had a PCN reaction causing immediate rash, facial/tongue/throat swelling, SOB or lightheadedness with hypotension: unknown Has patient had a PCN reaction causing severe rash involving mucus membranes or skin necrosis: unknown Has patient had a PCN reaction that required hospitalization unknown Has patient had a PCN reaction occurring within the last 10 years: no If all of the above answers are "NO", then may proceed with Cephalosporin use.       The results of significant diagnostics from this hospitalization (including imaging, microbiology, ancillary and laboratory) are listed below for reference.    Significant Diagnostic Studies: Dg Chest 1 View  Result Date: 07/06/2017 CLINICAL DATA:  Post right-sided thoracentesis EXAM: CHEST 1 VIEW COMPARISON:  07/04/2017; chest CT-07/05/2017 FINDINGS: Interval reduction in persistent small/trace right-sided effusion post thoracentesis. No pneumothorax. Improved aeration right lung base with persistent right basilar opacities. Grossly unchanged cardiac silhouette and mediastinal contours with atherosclerotic plaque thoracic aorta. Unchanged trace left-sided effusion associated left basilar opacities. No definite  evidence of edema. Post lower cervical ACDF, incompletely evaluated. IMPRESSION: 1. Interval reduction in persistent small/trace right-sided effusion post thoracentesis. No pneumothorax. 2. Improved aeration of the right lower lung with persistent right basilar opacities, atelectasis versus infiltrate. Electronically Signed   By: Simonne Come M.D.   On: 07/06/2017 11:26   Ct Head Wo Contrast  Result Date: 07/04/2017 CLINICAL DATA:  Altered mental status EXAM: CT HEAD WITHOUT CONTRAST TECHNIQUE: Contiguous axial images were obtained from the base of the skull through the vertex without intravenous contrast. COMPARISON:  03/23/2017 FINDINGS: Brain: No acute territorial infarction, hemorrhage, or intracranial mass is visualized. Moderate atrophy. Fairly extensive small vessel ischemic changes of the white matter. Stable ventricle size. Vascular: No hyperdense vessels. Vertebral artery and carotid vascular calcification. Skull: No fracture. Sinuses/Orbits: Mucosal thickening in the maxillary ethmoid and frontal sinuses. 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 No acute orbital abnormality Other: None IMPRESSION: 1. No definite CT evidence for acute intracranial abnormality. 2. Atrophy and small vessel ischemic changes of the white matter. Electronically Signed   By: Jasmine Pang M.D.   On: 07/04/2017 23:06   Ct Chest W Contrast  Result Date: 07/05/2017 CLINICAL DATA:  Pleural effusion.  Altered mental status. EXAM: CT CHEST, ABDOMEN, AND PELVIS WITH CONTRAST TECHNIQUE: Multidetector CT imaging of the chest, abdomen and pelvis was performed following the standard protocol during bolus administration of intravenous contrast. CONTRAST:  ISOVUE-300 IOPAMIDOL (ISOVUE-300) INJECTION 61% COMPARISON:  02/04/2017 FINDINGS: CT CHEST FINDINGS Cardiovascular: Chronic cardiomegaly, especially dilated is the right atrium. There is poor filling of the left atrial appendage. Diffuse atherosclerotic plaque. Known  fusiform ascending aortic aneurysm measuring up to 4.8 cm. No acute vascular finding. Mediastinum/Nodes: It negative for adenopathy or mass. Lungs/Pleura: Large right and small left pleural effusions. There is associated atelectasis with complete collapse of the right middle and right lower lobes. There is right upper and left lower atelectasis as well. Patchy airspace opacity in the left lung, greatest in the upper lobe. No generalized Kerley lines. Musculoskeletal: No acute or aggressive finding.  Anasarca CT ABDOMEN PELVIS FINDINGS Hepatobiliary: No focal liver abnormality.Cholecystectomy. Chronic common bile duct dilatation that is presumably physiologic. Pancreas: Chronic simple appearing cystic masses along the pancreas, including an exophytic mass from the head measuring up to 4 cm. No main duct dilatation seen. Generalized pancreatic atrophy. Spleen: Unremarkable. Adrenals/Urinary Tract: Negative adrenals. No hydronephrosis or stone. Unremarkable bladder. Stomach/Bowel: No obstruction. Colonic diverticulosis. No inflammatory changes. Vascular/Lymphatic: Extensive atherosclerotic plaque of the aorta and  visceral branches. There is a saccular aneurysm from the right common iliac artery measuring 17 mm in diameter, stable. Mild infrarenal fusiform aneurysmal enlargement. No acute vascular finding. No mass or adenopathy. Reproductive:Hysterectomy. Other: Small volume ascites likely from volume overload. There is generalized retroperitoneal edema. Musculoskeletal: Left hip arthroplasty with attendant artifact. There is advanced disc and facet degeneration with scoliosis and L4-5 anterolisthesis. IMPRESSION: 1. Pneumonia on the left, most extensive in the upper lobe. 2. Volume overload with anasarca and pleural effusions. Right pleural effusion is large and cuases right middle and lower lobe collapse. The left pleural effusion is small. 3. Non filling of the left atrial appendage which may be related to contrast  timing or thrombus. The appendage did opacify in 2018. Please correlate for atrial fibrillation. 4. Fusiform ascending aortic aneurysm measuring up to 4.8 cm, unchanged from 2018. 5. Multiple cystic pancreatic masses without worrisome growth since 2018. Electronically Signed   By: Marnee SpringJonathon  Watts M.D.   On: 07/05/2017 07:01   Ct Abdomen Pelvis W Contrast  Result Date: 07/05/2017 CLINICAL DATA:  Pleural effusion.  Altered mental status. EXAM: CT CHEST, ABDOMEN, AND PELVIS WITH CONTRAST TECHNIQUE: Multidetector CT imaging of the chest, abdomen and pelvis was performed following the standard protocol during bolus administration of intravenous contrast. CONTRAST:  100mL ISOVUE-300 IOPAMIDOL (ISOVUE-300) INJECTION 61% COMPARISON:  02/04/2017 FINDINGS: CT CHEST FINDINGS Cardiovascular: Chronic cardiomegaly, especially dilated is the right atrium. There is poor filling of the left atrial appendage. Diffuse atherosclerotic plaque. Known fusiform ascending aortic aneurysm measuring up to 4.8 cm. No acute vascular finding. Mediastinum/Nodes: It negative for adenopathy or mass. Lungs/Pleura: Large right and small left pleural effusions. There is associated atelectasis with complete collapse of the right middle and right lower lobes. There is right upper and left lower atelectasis as well. Patchy airspace opacity in the left lung, greatest in the upper lobe. No generalized Kerley lines. Musculoskeletal: No acute or aggressive finding.  Anasarca CT ABDOMEN PELVIS FINDINGS Hepatobiliary: No focal liver abnormality.Cholecystectomy. Chronic common bile duct dilatation that is presumably physiologic. Pancreas: Chronic simple appearing cystic masses along the pancreas, including an exophytic mass from the head measuring up to 4 cm. No main duct dilatation seen. Generalized pancreatic atrophy. Spleen: Unremarkable. Adrenals/Urinary Tract: Negative adrenals. No hydronephrosis or stone. Unremarkable bladder. Stomach/Bowel: No  obstruction. Colonic diverticulosis. No inflammatory changes. Vascular/Lymphatic: Extensive atherosclerotic plaque of the aorta and visceral branches. There is a saccular aneurysm from the right common iliac artery measuring 17 mm in diameter, stable. Mild infrarenal fusiform aneurysmal enlargement. No acute vascular finding. No mass or adenopathy. Reproductive:Hysterectomy. Other: Small volume ascites likely from volume overload. There is generalized retroperitoneal edema. Musculoskeletal: Left hip arthroplasty with attendant artifact. There is advanced disc and facet degeneration with scoliosis and L4-5 anterolisthesis. IMPRESSION: 1. Pneumonia on the left, most extensive in the upper lobe. 2. Volume overload with anasarca and pleural effusions. Right pleural effusion is large and cuases right middle and lower lobe collapse. The left pleural effusion is small. 3. Non filling of the left atrial appendage which may be related to contrast timing or thrombus. The appendage did opacify in 2018. Please correlate for atrial fibrillation. 4. Fusiform ascending aortic aneurysm measuring up to 4.8 cm, unchanged from 2018. 5. Multiple cystic pancreatic masses without worrisome growth since 2018. Electronically Signed   By: Marnee SpringJonathon  Watts M.D.   On: 07/05/2017 07:01   Dg Chest Port 1 View  Result Date: 07/09/2017 CLINICAL DATA:  Pleural effusion.  Bronchitis. EXAM: PORTABLE CHEST  1 VIEW COMPARISON:  07/08/2017 FINDINGS: Cardiomegaly again demonstrated. Persistent bilateral effusions. Persistent abnormal density in the lower lungs that could be volume loss or pneumonia. No worsening or new finding. IMPRESSION: No change. Cardiomegaly. Pleural effusions. Lower lobe volume loss and/or pneumonia. Electronically Signed   By: Paulina Fusi M.D.   On: 07/09/2017 07:51   Dg Chest Port 1 View  Result Date: 07/08/2017 CLINICAL DATA:  Cough EXAM: PORTABLE CHEST 1 VIEW COMPARISON:  07/06/2017 FINDINGS: Cardiomegaly again  demonstrated. There is worsening bilateral effusions with lower lobe atelectasis and/or pneumonia. Upper lobes remain largely clear. IMPRESSION: Radiographic worsening with enlarging effusions and worsening volume loss and/or infiltrate in both lower lobes. Electronically Signed   By: Paulina Fusi M.D.   On: 07/08/2017 07:40   Dg Chest Port 1 View  Result Date: 07/04/2017 CLINICAL DATA:  Code sepsis, unresponsive 2 days. EXAM: PORTABLE CHEST 1 VIEW COMPARISON:  03/23/2017 FINDINGS: Patient rotated to the right. Evidence of a moderate size right pleural effusion likely with associated basilar atelectasis. Mild prominence of the perihilar markings likely degree of vascular congestion. Stable cardiomegaly. Remainder of the exam is unchanged. IMPRESSION: Worsening moderate size right pleural effusion likely with associated right basilar atelectasis. Stable cardiomegaly with mild vascular congestion. Electronically Signed   By: Elberta Fortis M.D.   On: 07/04/2017 21:15   US Thoracentesis Asp Pleural Space W/img Guide  Result Date: 07/06/2017 INDICATION: Symptomatic right sided pleural effusion EXAM: US THORACENTESIS ASP PLEURAL SPACE W/IMG GUIDE COMPARISON:  Chest CT - 07/05/2017; chest radiograph - 07/04/2017 MEDICATIONS: None. COMPLICATIONS: None immediate. TECHNIQUE: Informed written consent was obtained from the patient's daughter after a discussion of the risks, benefits and alternatives to treatment. A timeout was performed prior to the initiation of the procedure. With the patient lying left lateral decubitus, initial ultrasound scanning demonstrates a moderate to large sized anechoic right-sided pleural effusion pleural effusion. The lower chest was prepped and draped in the usual sterile fashion. 1% lidocaine was used for local anesthesia. An ultrasound image was saved for documentation purposes. An 8 Fr Safe-T-Centesis catheter was introduced. The thoracentesis was performed. The catheter was removed and  a dressing was applied. The patient tolerated the procedure well without immediate post procedural complication. The patient was escorted to have an upright chest radiograph. FINDINGS: A total of approximately 1 liter of serous fluid was removed. Requested samples were sent to the laboratory. IMPRESSION: Successful ultrasound-guided right sided thoracentesis yielding 1 liter of pleural fluid. Electronically Signed   By: Simonne Come M.D.   On: 07/06/2017 11:27    Microbiology: Recent Results (from the past 240 hour(s))  Blood Cultures (routine x 2)     Status: None   Collection Time: 07/04/17  8:40 PM  Result Value Ref Range Status   Specimen Description BLOOD SITE NOT SPECIFIED  Final   Special Requests IN PEDIATRIC BOTTLE Blood Culture adequate volume  Final   Culture   Final    NO GROWTH 5 DAYS Performed at [facility], [address], [place], [place] [address]    Report Status 07/09/2017 FINAL  Final  Urine culture     Status: None   Collection Time: 07/04/17  8:48 PM  Result Value Ref Range Status   Specimen Description URINE, CATHETERIZED  Final   Special Requests NONE  Final   Culture   Final    NO GROWTH Performed at [facility], [address], [place], [place] [address]    Report  Status 07/06/2017 FINAL  Final  Blood Cultures (routine x 2)     Status: None   Collection Time: 07/04/17  9:15 PM  Result Value Ref Range Status   Specimen Description BLOOD RIGHT ANTECUBITAL  Final   Special Requests   Final    BOTTLES DRAWN AEROBIC AND ANAEROBIC Blood Culture adequate volume   Culture   Final    NO GROWTH 5 DAYS Performed at [facility], [address], [place], [place] [address]    Report Status 07/09/2017 FINAL  Final  Respiratory Panel by PCR     Status: None   Collection Time: 07/05/17 10:33 AM  Result Value Ref Range Status   Adenovirus NOT DETECTED NOT DETECTED Final   Coronavirus 229E NOT DETECTED NOT DETECTED Final   Coronavirus HKU1 NOT  DETECTED NOT DETECTED Final   Coronavirus NL63 NOT DETECTED NOT DETECTED Final   Coronavirus OC43 NOT DETECTED NOT DETECTED Final   Metapneumovirus NOT DETECTED NOT DETECTED Final   Rhinovirus / Enterovirus NOT DETECTED NOT DETECTED Final   Influenza A NOT DETECTED NOT DETECTED Final   Influenza B NOT DETECTED NOT DETECTED Final   Parainfluenza Virus 1 NOT DETECTED NOT DETECTED Final   Parainfluenza Virus 2 NOT DETECTED NOT DETECTED Final   Parainfluenza Virus 3 NOT DETECTED NOT DETECTED Final   Parainfluenza Virus 4 NOT DETECTED NOT DETECTED Final   Respiratory Syncytial Virus NOT DETECTED NOT DETECTED Final   Bordetella pertussis NOT DETECTED NOT DETECTED Final   Chlamydophila pneumoniae NOT DETECTED NOT DETECTED Final   Mycoplasma pneumoniae NOT DETECTED NOT DETECTED Final  Culture, body fluid-bottle     Status: None   Collection Time: 07/06/17 11:14 AM  Result Value Ref Range Status   Specimen Description PLEURAL  Final   Special Requests NONE  Final   Culture   Final    NO GROWTH 5 DAYS Performed at [facility], [address] [place], [place] [address]    Report Status 07/11/2017 FINAL  Final  Gram stain     Status: None   Collection Time: 07/06/17 11:14 AM  Result Value Ref Range Status   Specimen Description PLEURAL  Final   Special Requests NONE  Final   Gram Stain   Final    MODERATE WBC PRESENT, PREDOMINANTLY PMN NO ORGANISMS SEEN Performed at [facility], [address] [place], [place] [address]    Report Status 07/06/2017 FINAL  Final     Labs: Basic Metabolic Panel: Recent Labs  Lab 07/05/17 0414 07/06/17 0436 07/07/17 0834 07/08/17 0754 07/10/17 0907  NA 141 144 141 141 138  K 4.0 4.4 4.7 4.2 4.1  CL 107 109 110 110 105  CO2 24 24 22  21* 26  GLUCOSE 134* 143* 143* 138* 123*  BUN 38* 42* 46* 40* 29*  CREATININE 1.06* 1.03* 1.00 0.87 0.93  CALCIUM 8.8* 9.2 8.9 9.0 8.3*  MG  --  1.7 1.8 1.7 1.6*   Liver Function Tests: Recent Labs   Lab 07/04/17 2028 07/05/17 0414 07/07/17 0834 07/08/17 0754  AST 1,193* 1,119* 209* 168*  ALT 608* 627* 310* 289*  ALKPHOS 67 53 74 53  BILITOT 2.7* 2.0* 1.4* 1.4*  PROT 6.1* 5.5* 5.1* 5.2*  ALBUMIN 3.0* 2.7* 2.4* 2.5*   Recent Labs  Lab 07/04/17 2311  LIPASE 22   No results for input(s): AMMONIA in the last 168 hours. CBC: Recent Labs  Lab 07/04/17 2028 07/05/17 0414 07/06/17 0436 07/07/17  1610 07/08/17 0754 07/10/17 0907  WBC 15.4* 15.7* 13.2* 20.2* 18.8* 17.4*  NEUTROABS 12.3*  --   --   --   --  14.3*  HGB 8.8* 9.3* 10.6* 10.5* 11.2* 6.8*  HCT 29.5* 30.8* 36.2 34.9* 37.5 22.6*  MCV 101.0* 100.3* 100.8* 100.6* 100.5* 97.4  PLT 124* 121* 155 146* 132* 106*   Cardiac Enzymes: Recent Labs  Lab 07/05/17 0414 07/05/17 0813 07/05/17 1414  TROPONINI 0.03* 0.03* 0.03*   BNP: BNP (last 3 results) Recent Labs    03/23/17 1831  BNP 957.0*    ProBNP (last 3 results) No results for input(s): PROBNP in the last 8760 hours.  CBG: Recent Labs  Lab 07/04/17 2027  GLUCAP 140*       Signed:  Carolyne Littles, MD [facility] [phone] pager

## 2017-07-11 NOTE — Progress Notes (Signed)
Report called to [facility] at [facility].

## 2017-07-11 NOTE — Plan of Care (Signed)
  Progressing Coping: Level of anxiety will decrease 07/11/2017 1015 - Progressing by Quentin CornwallMadison, Phenix Grein, RN Elimination: Will not experience complications related to bowel motility 07/11/2017 1015 - Progressing by Quentin CornwallMadison, Jonathandavid Marlett, RN Will not experience complications related to urinary retention 07/11/2017 1015 - Progressing by Quentin CornwallMadison, Zafirah Vanzee, RN Pain Managment: General experience of comfort will improve 07/11/2017 1015 - Progressing by Quentin CornwallMadison, Koy Lamp, RN Safety: Ability to remain free from injury will improve 07/11/2017 1015 - Progressing by Quentin CornwallMadison, Raad Clayson, RN Skin Integrity: Risk for impaired skin integrity will decrease 07/11/2017 1015 - Progressing by Quentin CornwallMadison, Graeden Bitner, RN Clinical Measurements: Ability to maintain a body temperature in the normal range will improve 07/11/2017 1015 - Progressing by Quentin CornwallMadison, Draven Laine, RN   Not Progressing Education: Knowledge of General Education information will improve 07/11/2017 1015 - Not Progressing by Quentin CornwallMadison, Lesia Monica, RN Health Behavior/Discharge Planning: Ability to manage health-related needs will improve 07/11/2017 1015 - Not Progressing by Quentin CornwallMadison, Rishikesh Khachatryan, RN Clinical Measurements: Ability to maintain clinical measurements within normal limits will improve 07/11/2017 1015 - Not Progressing by Quentin CornwallMadison, Sulayman Manning, RN Will remain free from infection 07/11/2017 1015 - Not Progressing by Quentin CornwallMadison, Colbin Jovel, RN Diagnostic test results will improve 07/11/2017 1015 - Not Progressing by Quentin CornwallMadison, Juquan Reznick, RN Respiratory complications will improve 07/11/2017 1015 - Not Progressing by Quentin CornwallMadison, Sherronda Sweigert, RN Cardiovascular complication will be avoided 07/11/2017 1015 - Not Progressing by Quentin CornwallMadison, Tashawn Greff, RN Activity: Risk for activity intolerance will decrease 07/11/2017 1015 - Not Progressing by Quentin CornwallMadison, Rachit Grim, RN Nutrition: Adequate nutrition will be maintained 07/11/2017 1015 - Not Progressing by Quentin CornwallMadison, Caelie Remsburg, RN Activity: Ability to tolerate increased activity will  improve 07/11/2017 1015 - Not Progressing by Quentin CornwallMadison, Illiana Losurdo, RN Respiratory: Ability to maintain adequate ventilation will improve 07/11/2017 1015 - Not Progressing by Quentin CornwallMadison, Jaishon Krisher, RN Ability to maintain a clear airway will improve 07/11/2017 1015 - Not Progressing by Quentin CornwallMadison, Chales Pelissier, RN

## 2017-08-05 DEATH — deceased

## 2018-10-04 IMAGING — CT CT HEAD W/O CM
5 of 6 series · 15 of 47 positions shown, 16 images · non-contrast
Comparison: 03/23/2017

CLINICAL DATA: Altered mental status

EXAM:
CT HEAD WITHOUT CONTRAST
TECHNIQUE: Contiguous axial images were obtained from the base of the skull
through the vertex without intravenous contrast.

[Series 2: head without · axial · non-contrast · 0.47mm/px · z∈[-95,-40]mm · 2 of 34 slices shown, 3 images]
[im 12/34  brain]
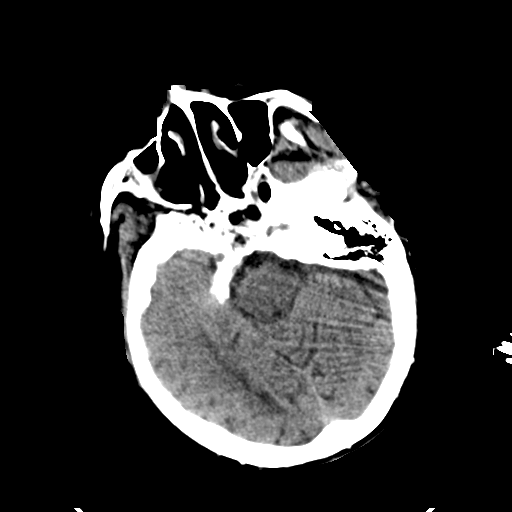
[im 12/34  bone]
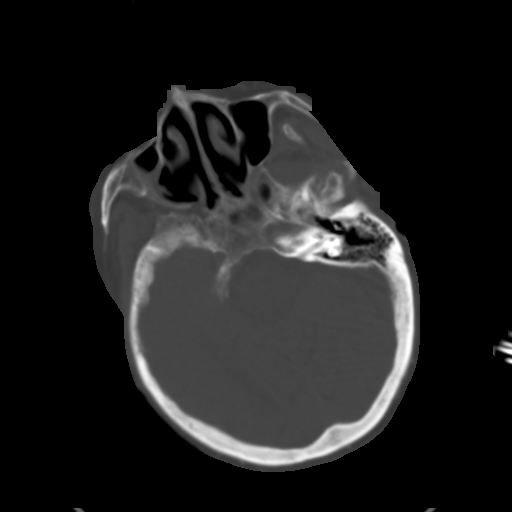
[im 23/34  brain]
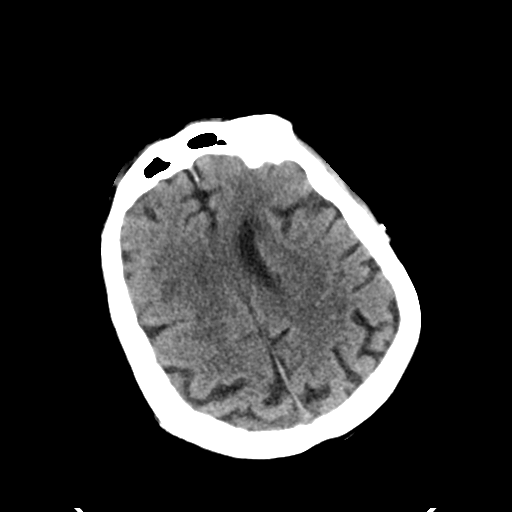

[Series 3: head bone · axial · 0.47mm/px · z∈[-134,-50]mm · 5 of 85 slices shown]
[im 9/85  bone]
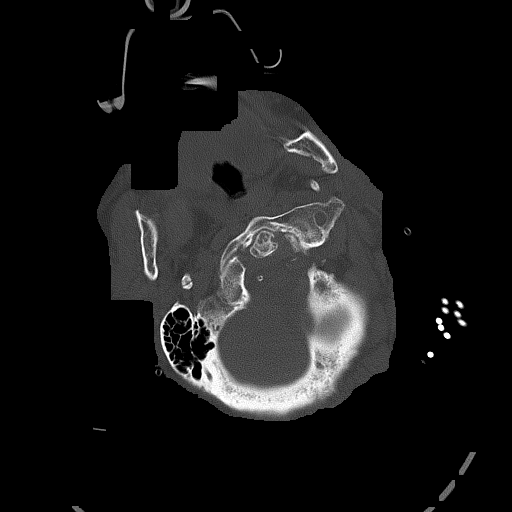
[im 17/85  bone]
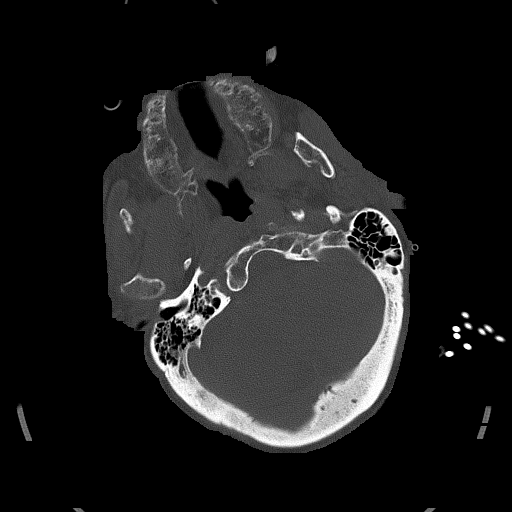
[im 26/85  bone]
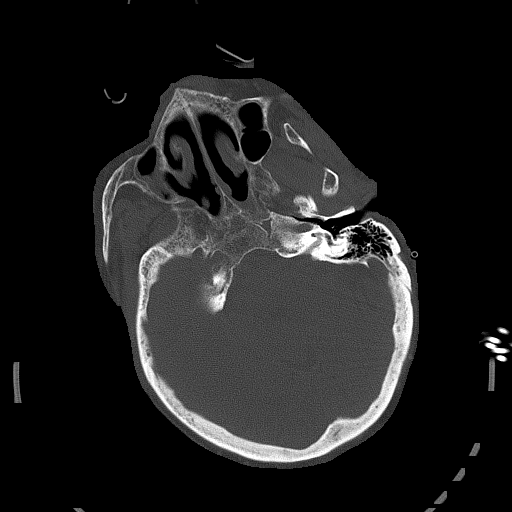
[im 34/85  bone]
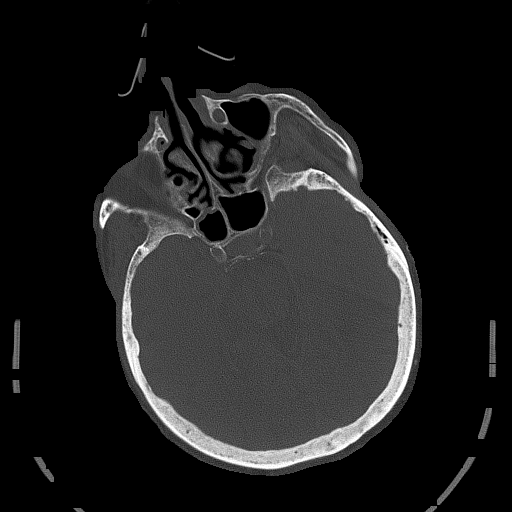
[im 51/85  bone]
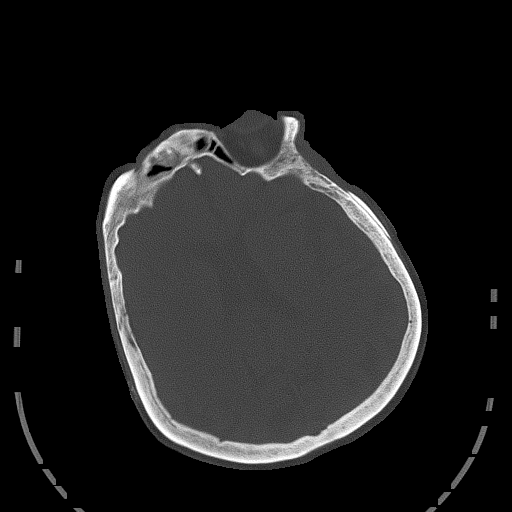

[Series 4: head without cor · coronal · non-contrast · 0.33mm/px · 3 of 74 slices shown]
[im 25/74  brain]
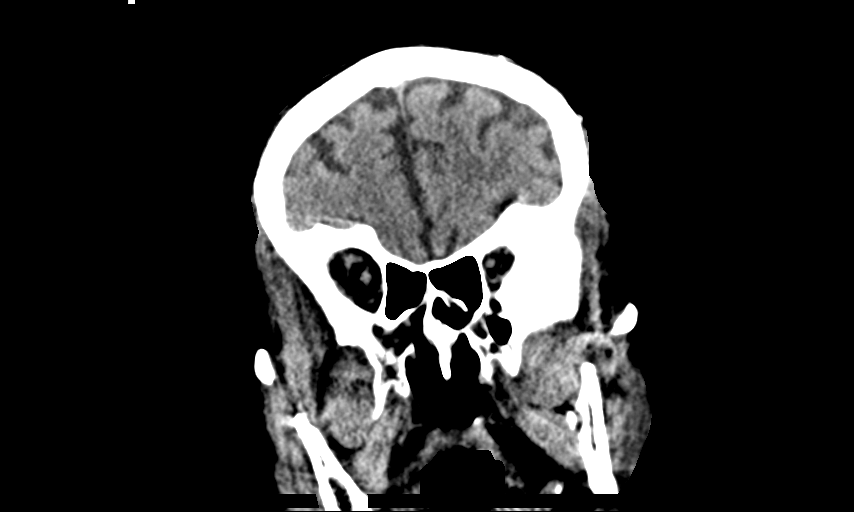
[im 33/74  brain]
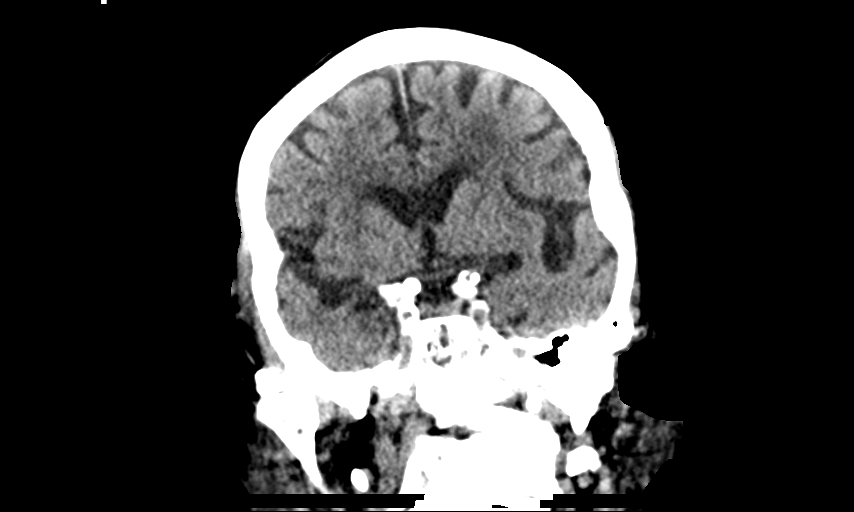
[im 41/74  brain]
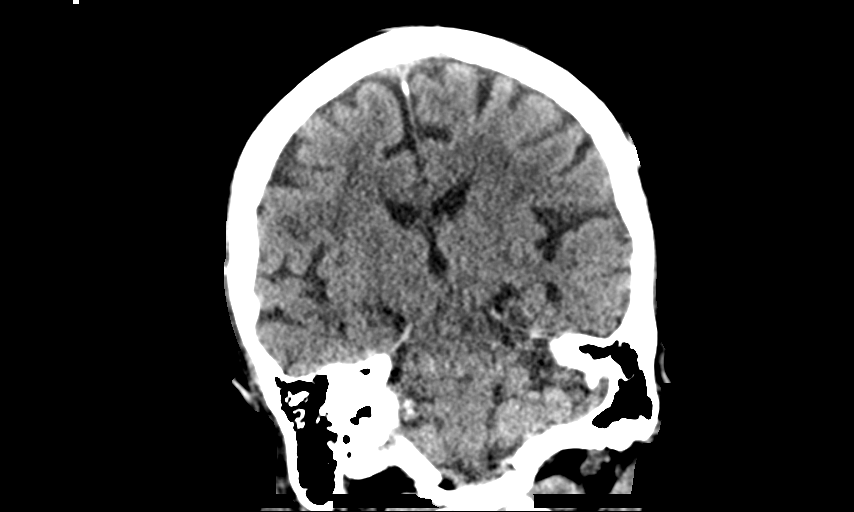

[Series 5: head without sag · sagittal · non-contrast · 0.33mm/px · 3 of 67 slices shown]
[im 23/67  brain]
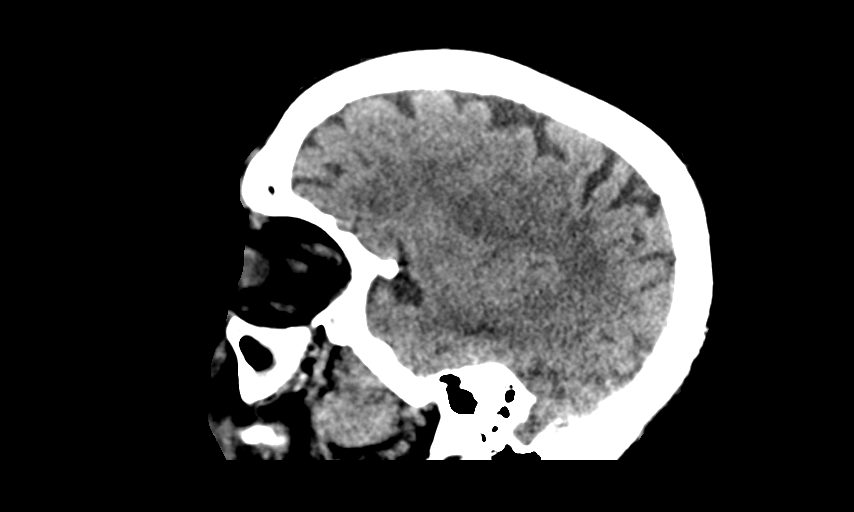
[im 34/67  brain]
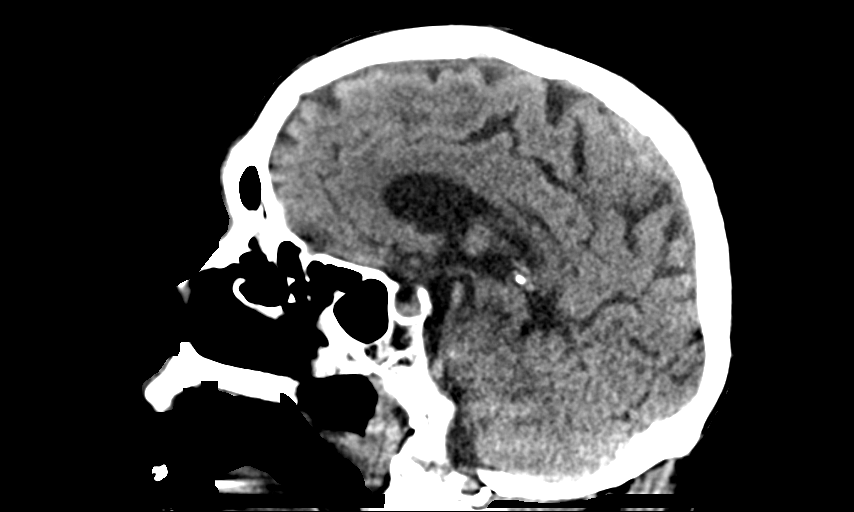
[im 45/67  brain]
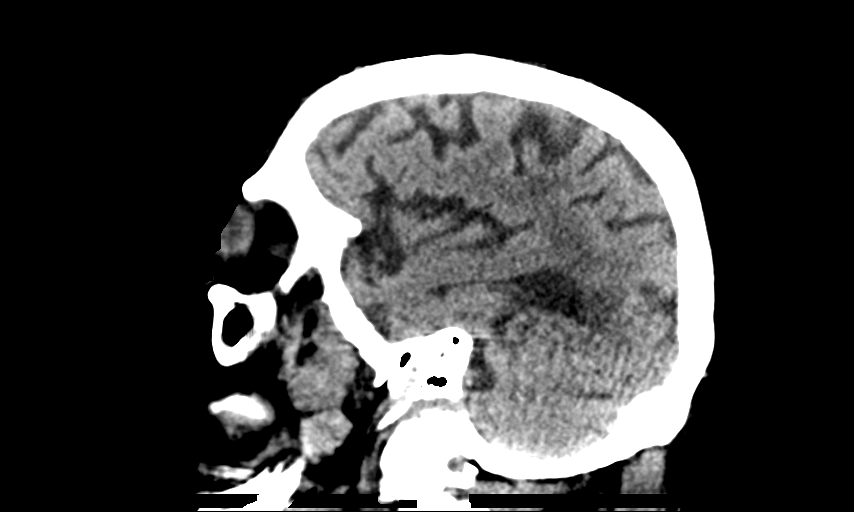

[Series 6: head without ax · axial · non-contrast · 0.47mm/px · z∈[-99,-44]mm · 2 of 34 slices shown]
[im 12/34  brain]
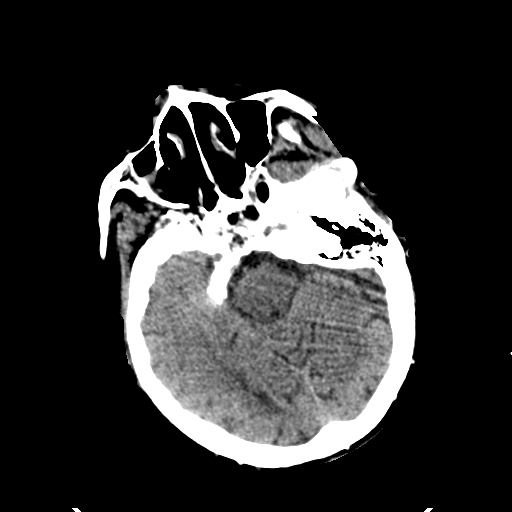
[im 23/34  brain]
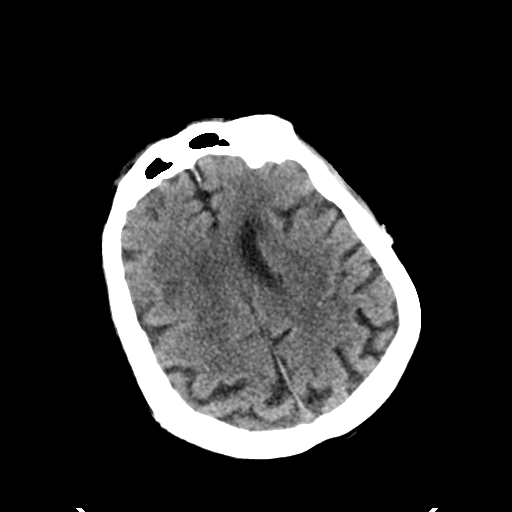

[15 of 47 positions shown; findings below may reference images not displayed]

FINDINGS: Brain: No acute territorial infarction, hemorrhage, or intracranial
mass is visualized. Moderate atrophy. Fairly extensive small vessel
ischemic changes of the white matter. Stable ventricle size.

Vascular: No hyperdense vessels. Vertebral artery and carotid
vascular calcification.

Skull: No fracture.

Sinuses/Orbits: Mucosal thickening in the maxillary ethmoid and
frontal sinuses. Probable osteoma in the right frontal sinus. No
acute orbital abnormality

Other: None
IMPRESSION: 1. No definite CT evidence for acute intracranial abnormality.
2. Atrophy and small vessel ischemic changes of the white matter.

## 2018-10-08 IMAGING — CR DG CHEST 1V PORT
1 series · 1 of 1 positions shown · non-contrast
Comparison: 07/06/2017

CLINICAL DATA: Cough

EXAM:
PORTABLE CHEST 1 VIEW

[AP]
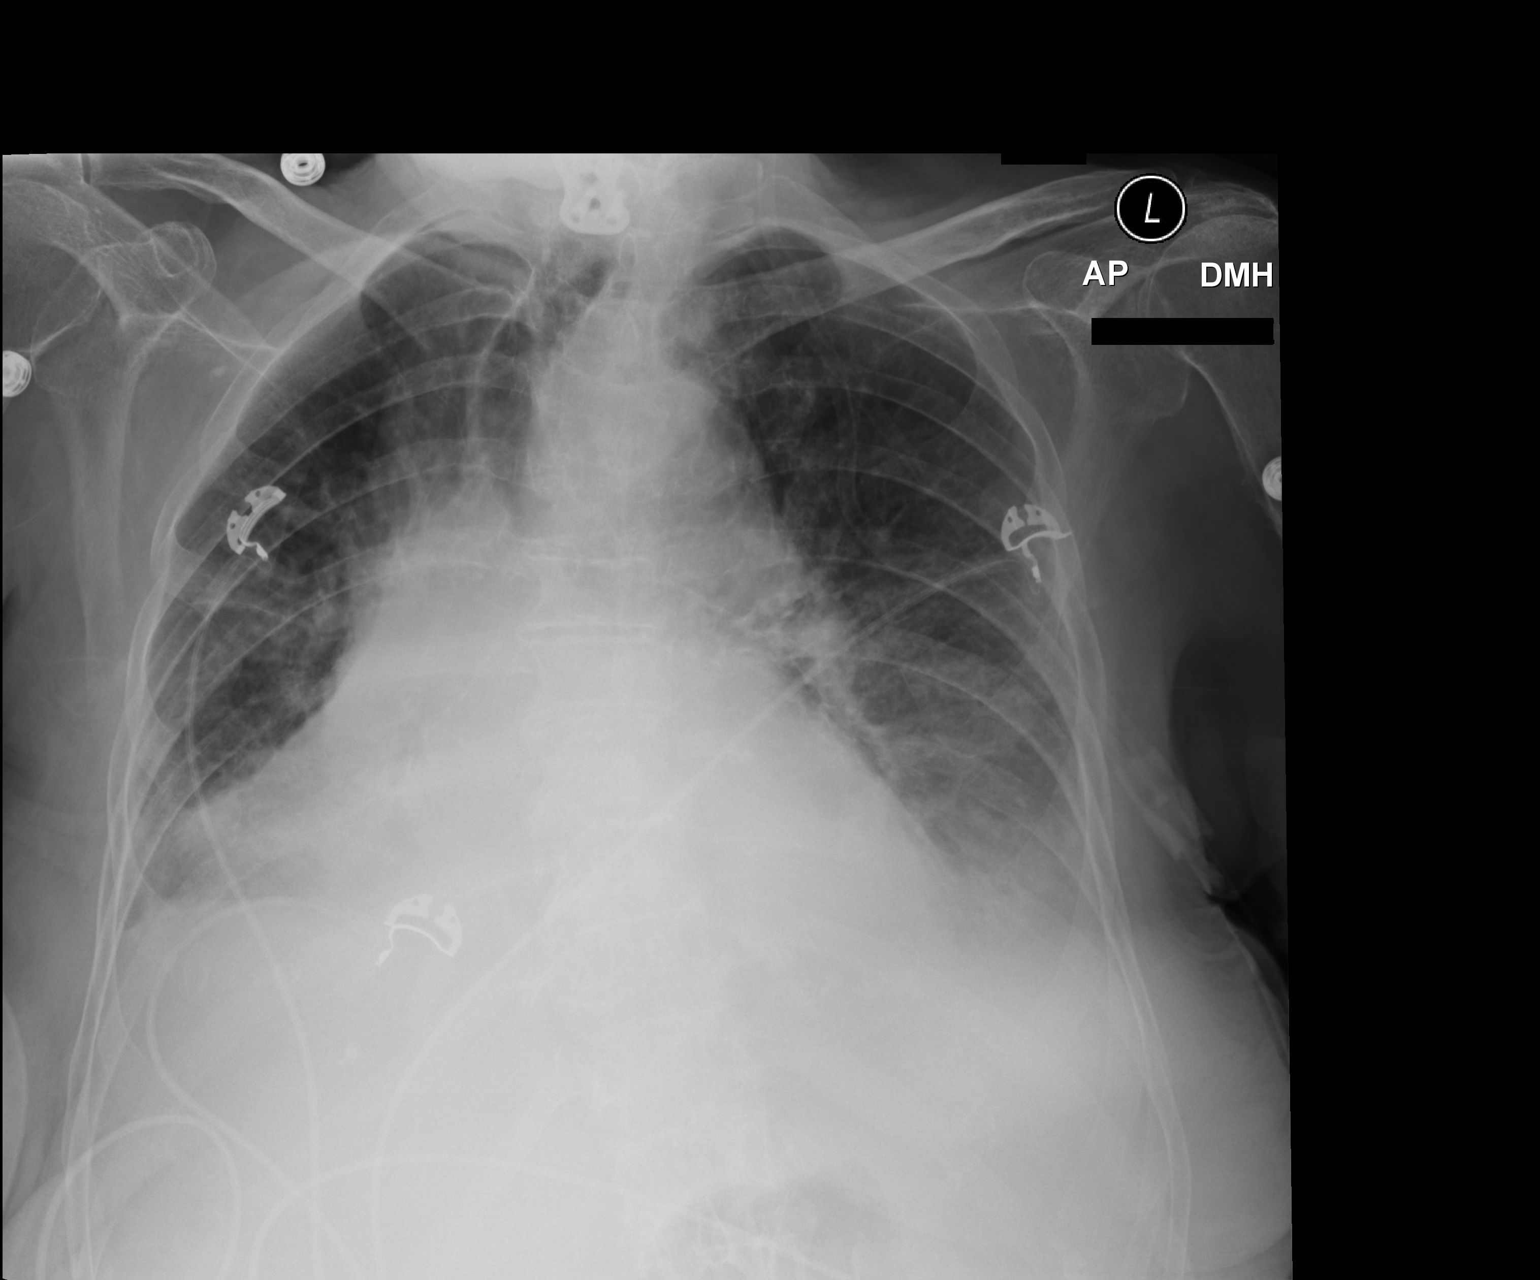

[1 of 1 positions shown; findings below may reference images not displayed]

FINDINGS: Cardiomegaly again demonstrated. There is worsening bilateral
effusions with lower lobe atelectasis and/or pneumonia. Upper lobes
remain largely clear.
IMPRESSION: Radiographic worsening with enlarging effusions and worsening volume
loss and/or infiltrate in both lower lobes.
